# Patient Record
Sex: Male | Born: 1980 | Race: Black or African American | Hispanic: No | Marital: Single | Smoking: Current every day smoker
Health system: Southern US, Community
[De-identification: ages and names within clinical notes are randomized; demographics above are authoritative.]

## PROBLEM LIST (undated history)

## (undated) DIAGNOSIS — J302 Other seasonal allergic rhinitis: Secondary | ICD-10-CM

## (undated) DIAGNOSIS — R569 Unspecified convulsions: Secondary | ICD-10-CM

## (undated) HISTORY — PX: HAND SURGERY: SHX662

## (undated) HISTORY — PX: WISDOM TOOTH EXTRACTION: SHX21

---

## 2001-02-17 ENCOUNTER — Emergency Department (HOSPITAL_COMMUNITY): Admission: EM | Admit: 2001-02-17 | Discharge: 2001-02-17 | Payer: Self-pay | Admitting: Emergency Medicine

## 2003-06-11 ENCOUNTER — Emergency Department (HOSPITAL_COMMUNITY): Admission: EM | Admit: 2003-06-11 | Discharge: 2003-06-11 | Payer: Self-pay | Admitting: Emergency Medicine

## 2003-11-11 ENCOUNTER — Emergency Department (HOSPITAL_COMMUNITY): Admission: EM | Admit: 2003-11-11 | Discharge: 2003-11-11 | Payer: Self-pay | Admitting: Emergency Medicine

## 2005-01-02 ENCOUNTER — Emergency Department (HOSPITAL_COMMUNITY): Admission: EM | Admit: 2005-01-02 | Discharge: 2005-01-02 | Payer: Self-pay | Admitting: Emergency Medicine

## 2005-01-10 ENCOUNTER — Emergency Department (HOSPITAL_COMMUNITY): Admission: EM | Admit: 2005-01-10 | Discharge: 2005-01-10 | Payer: Self-pay | Admitting: Emergency Medicine

## 2005-06-10 ENCOUNTER — Emergency Department (HOSPITAL_COMMUNITY): Admission: EM | Admit: 2005-06-10 | Discharge: 2005-06-10 | Payer: Self-pay | Admitting: Emergency Medicine

## 2006-06-30 ENCOUNTER — Emergency Department (HOSPITAL_COMMUNITY): Admission: EM | Admit: 2006-06-30 | Discharge: 2006-07-01 | Payer: Self-pay | Admitting: Emergency Medicine

## 2006-07-06 ENCOUNTER — Emergency Department (HOSPITAL_COMMUNITY): Admission: EM | Admit: 2006-07-06 | Discharge: 2006-07-06 | Payer: Self-pay | Admitting: Emergency Medicine

## 2007-10-19 ENCOUNTER — Emergency Department (HOSPITAL_COMMUNITY): Admission: EM | Admit: 2007-10-19 | Discharge: 2007-10-19 | Payer: Self-pay | Admitting: Emergency Medicine

## 2008-03-19 ENCOUNTER — Emergency Department (HOSPITAL_COMMUNITY): Admission: EM | Admit: 2008-03-19 | Discharge: 2008-03-19 | Payer: Self-pay | Admitting: Emergency Medicine

## 2008-12-15 ENCOUNTER — Emergency Department (HOSPITAL_COMMUNITY): Admission: EM | Admit: 2008-12-15 | Discharge: 2008-12-16 | Payer: Self-pay | Admitting: Emergency Medicine

## 2009-06-29 ENCOUNTER — Emergency Department (HOSPITAL_COMMUNITY): Admission: EM | Admit: 2009-06-29 | Discharge: 2009-06-29 | Payer: Self-pay | Admitting: Emergency Medicine

## 2010-06-06 LAB — GC/CHLAMYDIA PROBE AMP, GENITAL
Chlamydia, DNA Probe: NEGATIVE
GC Probe Amp, Genital: POSITIVE — AB

## 2010-06-14 ENCOUNTER — Emergency Department (HOSPITAL_COMMUNITY)
Admission: EM | Admit: 2010-06-14 | Discharge: 2010-06-14 | Disposition: A | Discharge status: Home / Self Care | Payer: Medicaid Other | Attending: Emergency Medicine | Admitting: Emergency Medicine

## 2010-06-14 ENCOUNTER — Emergency Department (HOSPITAL_COMMUNITY): Payer: Medicaid Other

## 2010-06-14 DIAGNOSIS — G40909 Epilepsy, unspecified, not intractable, without status epilepticus: Secondary | ICD-10-CM | POA: Insufficient documentation

## 2010-06-14 DIAGNOSIS — M79609 Pain in unspecified limb: Secondary | ICD-10-CM | POA: Insufficient documentation

## 2010-06-14 DIAGNOSIS — M7989 Other specified soft tissue disorders: Secondary | ICD-10-CM | POA: Insufficient documentation

## 2010-06-14 DIAGNOSIS — M2550 Pain in unspecified joint: Secondary | ICD-10-CM | POA: Insufficient documentation

## 2010-06-14 DIAGNOSIS — S63639A Sprain of interphalangeal joint of unspecified finger, initial encounter: Secondary | ICD-10-CM | POA: Insufficient documentation

## 2010-06-14 DIAGNOSIS — Y929 Unspecified place or not applicable: Secondary | ICD-10-CM | POA: Insufficient documentation

## 2010-06-14 DIAGNOSIS — Y9367 Activity, basketball: Secondary | ICD-10-CM | POA: Insufficient documentation

## 2010-06-14 DIAGNOSIS — F17200 Nicotine dependence, unspecified, uncomplicated: Secondary | ICD-10-CM | POA: Insufficient documentation

## 2010-06-14 DIAGNOSIS — W219XXA Striking against or struck by unspecified sports equipment, initial encounter: Secondary | ICD-10-CM | POA: Insufficient documentation

## 2010-06-17 LAB — CBC
HCT: 46.9 % (ref 39.0–52.0)
Hemoglobin: 15.4 g/dL (ref 13.0–17.0)
MCHC: 32.8 g/dL (ref 30.0–36.0)
MCV: 99.2 fL (ref 78.0–100.0)
Platelets: 233 10*3/uL (ref 150–400)
RBC: 4.73 MIL/uL (ref 4.22–5.81)
RDW: 13.1 % (ref 11.5–15.5)
WBC: 9.6 10*3/uL (ref 4.0–10.5)

## 2010-06-17 LAB — DIFFERENTIAL
Basophils Absolute: 0 10*3/uL (ref 0.0–0.1)
Basophils Relative: 0 % (ref 0–1)
Eosinophils Absolute: 0.2 10*3/uL (ref 0.0–0.7)
Eosinophils Relative: 3 % (ref 0–5)
Lymphocytes Relative: 25 % (ref 12–46)
Lymphs Abs: 2.4 10*3/uL (ref 0.7–4.0)
Monocytes Absolute: 0.6 10*3/uL (ref 0.1–1.0)
Monocytes Relative: 6 % (ref 3–12)
Neutro Abs: 6.3 10*3/uL (ref 1.7–7.7)
Neutrophils Relative %: 66 % (ref 43–77)

## 2010-06-17 LAB — RAPID URINE DRUG SCREEN, HOSP PERFORMED
Amphetamines: NOT DETECTED
Barbiturates: NOT DETECTED
Benzodiazepines: NOT DETECTED
Cocaine: NOT DETECTED
Opiates: NOT DETECTED
Tetrahydrocannabinol: POSITIVE — AB

## 2010-06-17 LAB — COMPREHENSIVE METABOLIC PANEL
ALT: 21 U/L (ref 0–53)
AST: 24 U/L (ref 0–37)
Albumin: 4.6 g/dL (ref 3.5–5.2)
Alkaline Phosphatase: 79 U/L (ref 39–117)
BUN: 6 mg/dL (ref 6–23)
CO2: 23 mEq/L (ref 19–32)
Calcium: 9.7 mg/dL (ref 8.4–10.5)
Chloride: 103 mEq/L (ref 96–112)
Creatinine, Ser: 0.98 mg/dL (ref 0.4–1.5)
GFR calc Af Amer: >60 mL/min (ref >60)
GFR calc non Af Amer: >60 mL/min (ref >60)
Glucose, Bld: 103 mg/dL — ABNORMAL HIGH (ref 70–99)
Potassium: 3.3 mEq/L — ABNORMAL LOW (ref 3.5–5.1)
Sodium: 139 mEq/L (ref 135–145)
Total Bilirubin: 0.6 mg/dL (ref 0.3–1.2)
Total Protein: 7.4 g/dL (ref 6.0–8.3)

## 2010-06-17 LAB — URINALYSIS, ROUTINE W REFLEX MICROSCOPIC
Bilirubin Urine: NEGATIVE
Glucose, UA: NEGATIVE mg/dL
Hgb urine dipstick: NEGATIVE
Ketones, ur: NEGATIVE mg/dL
Nitrite: NEGATIVE
Protein, ur: NEGATIVE mg/dL
Specific Gravity, Urine: <1.005 — ABNORMAL LOW (ref 1.005–1.030)
Urobilinogen, UA: 0.2 mg/dL (ref 0.0–1.0)
pH: 5.5 (ref 5.0–8.0)

## 2010-06-17 LAB — ETHANOL: Alcohol, Ethyl (B): 214 mg/dL — ABNORMAL HIGH (ref 0–10)

## 2010-06-21 ENCOUNTER — Emergency Department (HOSPITAL_COMMUNITY)
Admission: EM | Admit: 2010-06-21 | Discharge: 2010-06-21 | Disposition: A | Discharge status: Home / Self Care | Payer: Medicaid Other | Attending: Emergency Medicine | Admitting: Emergency Medicine

## 2010-06-21 DIAGNOSIS — W219XXA Striking against or struck by unspecified sports equipment, initial encounter: Secondary | ICD-10-CM | POA: Insufficient documentation

## 2010-06-21 DIAGNOSIS — S6390XA Sprain of unspecified part of unspecified wrist and hand, initial encounter: Secondary | ICD-10-CM | POA: Insufficient documentation

## 2010-06-21 DIAGNOSIS — Y9367 Activity, basketball: Secondary | ICD-10-CM | POA: Insufficient documentation

## 2010-06-21 DIAGNOSIS — Y9239 Other specified sports and athletic area as the place of occurrence of the external cause: Secondary | ICD-10-CM | POA: Insufficient documentation

## 2010-11-23 ENCOUNTER — Emergency Department (HOSPITAL_COMMUNITY)
Admission: EM | Admit: 2010-11-23 | Discharge: 2010-11-23 | Disposition: A | Discharge status: Home / Self Care | Payer: Medicaid Other | Attending: Emergency Medicine | Admitting: Emergency Medicine

## 2010-11-23 ENCOUNTER — Emergency Department (HOSPITAL_COMMUNITY): Payer: Medicaid Other

## 2010-11-23 DIAGNOSIS — R059 Cough, unspecified: Secondary | ICD-10-CM | POA: Insufficient documentation

## 2010-11-23 DIAGNOSIS — J069 Acute upper respiratory infection, unspecified: Secondary | ICD-10-CM

## 2010-11-23 DIAGNOSIS — R05 Cough: Secondary | ICD-10-CM | POA: Insufficient documentation

## 2010-11-23 DIAGNOSIS — J029 Acute pharyngitis, unspecified: Secondary | ICD-10-CM | POA: Insufficient documentation

## 2010-11-23 LAB — RAPID STREP SCREEN (MED CTR MEBANE ONLY): Streptococcus, Group A Screen (Direct): NEGATIVE

## 2010-11-23 MED ORDER — IBUPROFEN 400 MG PO TABS
400.0000 mg | ORAL_TABLET | Freq: Once | ORAL | Status: AC
  Administered 2010-11-23: 400 mg via ORAL
  Filled 2010-11-23: qty 1

## 2010-11-23 MED ORDER — ACETAMINOPHEN 500 MG PO TABS
1000.0000 mg | ORAL_TABLET | Freq: Once | ORAL | Status: AC
  Administered 2010-11-23: 1000 mg via ORAL
  Filled 2010-11-23: qty 2

## 2010-11-23 MED ORDER — ALBUTEROL SULFATE HFA 108 (90 BASE) MCG/ACT IN AERS
2.0000 | INHALATION_SPRAY | RESPIRATORY_TRACT | Status: AC
  Administered 2010-11-23: 2 via RESPIRATORY_TRACT
  Filled 2010-11-23: qty 6.7

## 2010-11-23 NOTE — ED Provider Notes (Signed)
History  Scribed for Dr. Clarene Duke, the patient was seen in room 19. The chart was scribed by Gilman Schmidt. The patients care was started at 0740.  CSN: [id number] Arrival date & time: 11/23/2010  7:32 AM  Chief Complaint  Patient presents with  . Nasal Congestion  . Sore Throat     HPI Pt was seen at 0740.  Per pt, c/o gradual onset and persistence of constant runny/stuffy nose, sore throat, sinus and ears congestion that began last night.  Has not taken any OTC's for same.  Denies fevers, no rash, no CP/SOB, no abd pain, no N/V/D.    PAST MEDICAL HISTORY:  History reviewed. No pertinent past medical history.   PAST SURGICAL HISTORY:  History reviewed. No pertinent past surgical history.   MEDICATIONS:  Previous Medications   No medications on file     ALLERGIES:  Allergies as of 11/23/2010  . (No Known Allergies)      SOCIAL HISTORY: History  Substance Use Topics  . Smoking status: Current Everyday Smoker -- 1.0 packs/day  . Smokeless tobacco: Not on file  . Alcohol Use: Yes     " a little bit"       Review of Systems  Review of Systems ROS: Statement: All systems negative except as marked or noted in the HPI; Constitutional: Negative for fever and chills. ; ; Eyes: Negative for eye pain, redness and discharge. ; ; ENMT: Negative for ear pain, hoarseness, +runny/stuffy nose, nasal congestion, sinus pressure and sore throat.; Cardiovascular: Negative for chest pain, palpitations, diaphoresis, dyspnea and peripheral edema. ; ; Respiratory: Negative for cough, wheezing and stridor. ; ; Gastrointestinal: Negative for nausea, vomiting, diarrhea and abdominal pain, blood in stool, hematemesis, jaundice and rectal bleeding. . ; ; Genitourinary: Negative for dysuria, flank pain and hematuria. ; ; Musculoskeletal: Negative for back pain and neck pain. Negative for swelling and trauma.; ; Skin: Negative for pruritus, rash, abrasions, blisters, bruising and skin lesion.; ; Neuro:  Negative for headache, lightheadedness and neck stiffness. Negative for weakness, altered level of consciousness , altered mental status, extremity weakness, paresthesias, involuntary movement, seizure and syncope.     Physical Exam    BP 139/68  Pulse 64  Temp(Src) 97.6 F (36.4 C) (Oral)  Resp 20  Ht 5\' 6"  (1.676 m)  Wt 145 lb (65.772 kg)  BMI 23.40 kg/m2  SpO2 100%  Physical Exam 0745: Physical examination:  Nursing notes reviewed; Vital signs and O2 SAT reviewed;  Constitutional: Well developed, Well nourished, Well hydrated, In no acute distress; Head:  Normocephalic, atraumatic; Eyes: EOMI, PERRL, No scleral icterus; ENMT: TM's clear bilat.  +edemetous nasal turbinates bilat with clear rhinorrhea.  Mouth and pharynx normal, Mucous membranes moist, no intra-oral edema, no hoarse voice, no drooling or stridor; Neck: Supple, Full range of motion, No lymphadenopathy, no meningeal signs; Cardiovascular: Regular rate and rhythm, No murmur, rub, or gallop; Respiratory: Breath sounds clear & equal bilaterally, No rales, rhonchi, wheezes, or rub, Normal respiratory effort/excursion; Chest: Nontender, Movement normal; Abdomen: Soft, Nontender, Nondistended, Normal bowel sounds;  Extremities: Pulses normal, No tenderness, No edema, No calf edema or asymmetry.; Neuro: AA&Ox3, Major CN grossly intact.  Speech clear.  No gross focal motor or sensory deficits in extremities.; Skin: Color normal, Warm, Dry, no rash, no petechiae.    MDM Reviewed: nursing note and vitals Interpretation: labs and x-ray   7:59 AM:  Pt told ED RN that he is "coughing" and "chest feels tight."  +rhinorrhea and hyperventilating  on exam, appears anxious.  Sats 100% R/A, lungs CTA bilat.  No wheezing or stridor.  When asked what is wrong, pt states he "can't breathe out of my nose" and that has "gotten me upset."  Instructed how to mouth-breath and blow his nose frequently.  Will obtain CXR r/o infiltrate.     LABS:    Results for orders placed during the hospital encounter of 11/23/10  RAPID STREP SCREEN      Component Value Range   Streptococcus, Group A Screen (Direct) NEGATIVE  NEGATIVE    RADIOLOGY:  Dg Chest 2 View  11/23/2010  *RADIOLOGY REPORT*  Clinical Data: Fever and leg weakness  CHEST - 2 VIEW  Comparison: None  Findings: Normal mediastinum and cardiac silhouette.  Normal pulmonary  vasculature.  No evidence of effusion, infiltrate, or pneumothorax.  No acute bony abnormality.  IMPRESSION: No acute cardiopulmonary process.  Original Report Authenticated By: Genevive Bi, M.D.    Medications given in ED:  acetaminophen (TYLENOL) tablet 1,000 mg (1000 mg Oral Given 11/23/10 0807)  ibuprofen (ADVIL,MOTRIN) tablet 400 mg (400 mg Oral Given 11/23/10 0807)  albuterol (PROVENTIL HFA;VENTOLIN HFA) inhaler 2 puff (2 puff Inhalation Given 11/23/10 0836)    9:33 AM:  Improved after meds.  Wants to go home now.  Dx testing d/w pt and family.  Questions answered.  Verb understanding, agreeable to d/c home with outpt f/u.  [facility] M     I personally performed the services described in this documentation, which was scribed in my presence. The recorded information has been reviewed and considered. [facility] M    Procedures          Laray Anger, DO 11/23/10 2057

## 2010-11-23 NOTE — ED Notes (Signed)
Pt c/o sudden onset of midsternal chest pain. Pt describes it as tightness. I placed pt on monitor and EDP notified.

## 2010-11-23 NOTE — ED Notes (Signed)
Pt presents with c/o nasal congestion and sore throat x 1 day. Pt denies taking any medication for symptoms. No resp distress in triage.

## 2011-02-05 ENCOUNTER — Encounter (HOSPITAL_COMMUNITY): Payer: Self-pay | Admitting: *Deleted

## 2011-02-05 ENCOUNTER — Emergency Department (HOSPITAL_COMMUNITY)
Admission: EM | Admit: 2011-02-05 | Discharge: 2011-02-05 | Disposition: A | Discharge status: Home / Self Care | Payer: Medicaid Other | Attending: Emergency Medicine | Admitting: Emergency Medicine

## 2011-02-05 DIAGNOSIS — K089 Disorder of teeth and supporting structures, unspecified: Secondary | ICD-10-CM | POA: Insufficient documentation

## 2011-02-05 DIAGNOSIS — F17200 Nicotine dependence, unspecified, uncomplicated: Secondary | ICD-10-CM | POA: Insufficient documentation

## 2011-02-05 DIAGNOSIS — K0889 Other specified disorders of teeth and supporting structures: Secondary | ICD-10-CM

## 2011-02-05 MED ORDER — PENICILLIN V POTASSIUM 250 MG PO TABS
500.0000 mg | ORAL_TABLET | Freq: Once | ORAL | Status: AC
  Administered 2011-02-05: 500 mg via ORAL

## 2011-02-05 MED ORDER — IBUPROFEN 800 MG PO TABS
800.0000 mg | ORAL_TABLET | Freq: Once | ORAL | Status: AC
  Administered 2011-02-05: 800 mg via ORAL

## 2011-02-05 MED ORDER — PENICILLIN V POTASSIUM 250 MG PO TABS
ORAL_TABLET | ORAL | Status: AC
  Filled 2011-02-05: qty 2

## 2011-02-05 MED ORDER — IBUPROFEN 800 MG PO TABS
ORAL_TABLET | ORAL | Status: AC
  Filled 2011-02-05: qty 1

## 2011-02-05 MED ORDER — PENICILLIN V POTASSIUM 500 MG PO TABS: 500.0000 mg | ORAL_TABLET | Freq: Three times a day (TID) | ORAL | Status: AC

## 2011-02-05 MED ORDER — HYDROCODONE-ACETAMINOPHEN 5-500 MG PO TABS: 1.0000 | ORAL_TABLET | Freq: Four times a day (QID) | ORAL | Status: DC | PRN

## 2011-02-05 NOTE — ED Notes (Signed)
Dr. Delo at bedside. 

## 2011-02-05 NOTE — ED Provider Notes (Signed)
History     CSN: [id number] Arrival date & time: 02/05/2011  3:35 AM   First MD Initiated Contact with Patient 02/05/11 [phone]      Chief Complaint  Patient presents with  . Dental Pain    (Consider location/radiation/quality/duration/timing/severity/associated sxs/prior treatment) Patient is a 30 y.o. male presenting with tooth pain. The history is provided by the patient.  Dental PainThe primary symptoms include mouth pain. The symptoms began 2 days ago. The symptoms are worsening. The symptoms are new. The symptoms occur constantly.  Additional symptoms include: dental sensitivity to temperature.    History reviewed. No pertinent past medical history.  History reviewed. No pertinent past surgical history.  No family history on file.  History  Substance Use Topics  . Smoking status: Current Everyday Smoker -- 1.0 packs/day  . Smokeless tobacco: Not on file  . Alcohol Use: Yes     " a little bit"      Review of Systems  All other systems reviewed and are negative.    Allergies  Review of patient's allergies indicates no known allergies.  Home Medications  No current outpatient prescriptions on file.  BP 137/72  Pulse 83  Temp(Src) 97.8 F (36.6 C) (Oral)  Resp 18  Ht 5\' 6"  (1.676 m)  Wt 161 lb (73.029 kg)  BMI 25.99 kg/m2  SpO2 99%  Physical Exam  Constitutional: He is oriented to person, place, and time. He appears well-developed and well-nourished.  HENT:  Head: Normocephalic and atraumatic.       The left lower rear molar is tender with erythema surrounding it.  There is no abscess that I can appreciate.  Neck: Normal range of motion. Neck supple.  Lymphadenopathy:    He has no cervical adenopathy.  Neurological: He is alert and oriented to person, place, and time.  Skin: Skin is warm and dry.    ED Course  Procedures (including critical care time)  Labs Reviewed - No data to display No results found.   No diagnosis found.    MDM  Pain  meds, antibx, follow up with dentist.        Geoffery Lyons, MD 02/05/11 [phone]

## 2011-02-05 NOTE — ED Notes (Signed)
Pt reports toothache on left side.  States it has been hurting since Sunday.  Pt states he was supposed to see a dentist 2 months ago but did not follow up.

## 2011-02-05 NOTE — ED Notes (Signed)
Pt reports pain to rt side of mouth upper and lower teeth x 3 days

## 2011-02-13 ENCOUNTER — Emergency Department (HOSPITAL_COMMUNITY)
Admission: EM | Admit: 2011-02-13 | Discharge: 2011-02-13 | Disposition: A | Discharge status: Home / Self Care | Payer: Medicaid Other | Attending: Emergency Medicine | Admitting: Emergency Medicine

## 2011-02-13 ENCOUNTER — Encounter (HOSPITAL_COMMUNITY): Payer: Self-pay | Admitting: Emergency Medicine

## 2011-02-13 DIAGNOSIS — F17200 Nicotine dependence, unspecified, uncomplicated: Secondary | ICD-10-CM | POA: Insufficient documentation

## 2011-02-13 DIAGNOSIS — S40029A Contusion of unspecified upper arm, initial encounter: Secondary | ICD-10-CM | POA: Insufficient documentation

## 2011-02-13 DIAGNOSIS — S40021A Contusion of right upper arm, initial encounter: Secondary | ICD-10-CM

## 2011-02-13 DIAGNOSIS — Y9229 Other specified public building as the place of occurrence of the external cause: Secondary | ICD-10-CM | POA: Insufficient documentation

## 2011-02-13 DIAGNOSIS — X58XXXA Exposure to other specified factors, initial encounter: Secondary | ICD-10-CM | POA: Insufficient documentation

## 2011-02-13 MED ORDER — IBUPROFEN 800 MG PO TABS
800.0000 mg | ORAL_TABLET | Freq: Once | ORAL | Status: AC
  Administered 2011-02-13: 800 mg via ORAL
  Filled 2011-02-13: qty 1

## 2011-02-13 NOTE — ED Provider Notes (Signed)
Medical screening examination/treatment/procedure(s) were performed by non-physician practitioner and as supervising physician I was immediately available for consultation/collaboration.   Briona Korpela L Elva Breaker, MD 02/13/11 1508 

## 2011-02-13 NOTE — ED Provider Notes (Signed)
History     CSN: [id number] Arrival date & time: 02/13/2011 11:13 AM   First MD Initiated Contact with Patient 02/13/11 1343      Chief Complaint  Patient presents with  . Bleeding/Bruising    (Consider location/radiation/quality/duration/timing/severity/associated sxs/prior treatment) HPI Comments: Pt underwent sedation for extraction of 4 wisdom teeth yest at dr. Allena Earing' office.  He says they had some difficulty rousing him from sedation and repeatedly pinched his R inner biceps area.  He feels they were intentionally mistreating him.  He wants [place] to take pictures.  The history is provided by the patient and the spouse. No language interpreter was used.    History reviewed. No pertinent past medical history.  Past Surgical History  Procedure Date  . Wisdom tooth extraction     History reviewed. No pertinent family history.  History  Substance Use Topics  . Smoking status: Current Everyday Smoker -- 1.0 packs/day  . Smokeless tobacco: Not on file  . Alcohol Use: Yes     " a little bit"      Review of Systems  Skin:       Ecchymosis   All other systems reviewed and are negative.    Allergies  Review of patient's allergies indicates no known allergies.  Home Medications   Current Outpatient Rx  Name Route Sig Dispense Refill  . HYDROCODONE-ACETAMINOPHEN 5-325 MG PO TABS Oral Take 1 tablet by mouth every 4 (four) hours as needed. For pain     . IBUPROFEN 800 MG PO TABS Oral Take 800 mg by mouth every 6 (six) hours as needed. For pain     . PENICILLIN V POTASSIUM 500 MG PO TABS Oral Take 1 tablet (500 mg total) by mouth 3 (three) times daily. 30 tablet 0    BP 125/79  Pulse 83  Temp(Src) 98.5 F (36.9 C) (Oral)  Resp 18  Ht 5\' 6"  (1.676 m)  Wt 160 lb (72.576 kg)  BMI 25.82 kg/m2  SpO2 100%  Physical Exam  Nursing note and vitals reviewed. Constitutional: He is oriented to person, place, and time. He appears well-developed and well-nourished.    HENT:  Head: Normocephalic and atraumatic.  Eyes: EOM are normal.  Neck: Normal range of motion.  Cardiovascular: Normal rate, regular rhythm, normal heart sounds and intact distal pulses.   Pulmonary/Chest: Effort normal and breath sounds normal. No respiratory distress.  Abdominal: Soft. He exhibits no distension. There is no tenderness.  Musculoskeletal: Normal range of motion. He exhibits tenderness.       Arms: Neurological: He is alert and oriented to person, place, and time.  Skin: Skin is warm and dry.  Psychiatric: He has a normal mood and affect. Judgment normal.    ED Course  Procedures (including critical care time)  Labs Reviewed - No data to display No results found.   No diagnosis found.    MDM          Worthy Rancher, PA 02/13/11 1414

## 2011-02-13 NOTE — ED Notes (Signed)
Pt c/o bruising on right upper arm from being pinched at the dentist's office while coming out of anesthesia.

## 2011-06-10 ENCOUNTER — Emergency Department (HOSPITAL_COMMUNITY)
Admission: EM | Admit: 2011-06-10 | Discharge: 2011-06-10 | Disposition: A | Discharge status: Home / Self Care | Payer: Medicaid Other | Attending: Emergency Medicine | Admitting: Emergency Medicine

## 2011-06-10 ENCOUNTER — Emergency Department (HOSPITAL_COMMUNITY): Payer: Medicaid Other

## 2011-06-10 ENCOUNTER — Encounter (HOSPITAL_COMMUNITY): Payer: Self-pay

## 2011-06-10 ENCOUNTER — Other Ambulatory Visit: Payer: Self-pay

## 2011-06-10 DIAGNOSIS — R0789 Other chest pain: Secondary | ICD-10-CM

## 2011-06-10 DIAGNOSIS — J020 Streptococcal pharyngitis: Secondary | ICD-10-CM | POA: Insufficient documentation

## 2011-06-10 DIAGNOSIS — R071 Chest pain on breathing: Secondary | ICD-10-CM | POA: Insufficient documentation

## 2011-06-10 DIAGNOSIS — R079 Chest pain, unspecified: Secondary | ICD-10-CM | POA: Insufficient documentation

## 2011-06-10 LAB — RAPID STREP SCREEN (MED CTR MEBANE ONLY): Streptococcus, Group A Screen (Direct): POSITIVE — AB

## 2011-06-10 MED ORDER — FAMOTIDINE 20 MG PO TABS
20.0000 mg | ORAL_TABLET | Freq: Once | ORAL | Status: AC
  Administered 2011-06-10: 20 mg via ORAL
  Filled 2011-06-10: qty 1

## 2011-06-10 MED ORDER — IBUPROFEN 800 MG PO TABS
800.0000 mg | ORAL_TABLET | Freq: Once | ORAL | Status: AC
  Administered 2011-06-10: 800 mg via ORAL
  Filled 2011-06-10: qty 1

## 2011-06-10 MED ORDER — PENICILLIN V POTASSIUM 500 MG PO TABS: 500.0000 mg | ORAL_TABLET | Freq: Four times a day (QID) | ORAL | Status: AC

## 2011-06-10 MED ORDER — PENICILLIN V POTASSIUM 250 MG PO TABS
1000.0000 mg | ORAL_TABLET | Freq: Once | ORAL | Status: AC
  Administered 2011-06-10: 1000 mg via ORAL
  Filled 2011-06-10: qty 4

## 2011-06-10 NOTE — ED Notes (Signed)
Complain of sore throat 

## 2011-06-10 NOTE — Discharge Instructions (Signed)
Chest Pain (Nonspecific) It is often hard to give a specific diagnosis for the cause of chest pain. There is always a chance that your pain could be related to something serious, such as a heart attack or a blood clot in the lungs. You need to follow up with your caregiver for further evaluation. CAUSES   Heartburn.   Pneumonia or bronchitis.   Anxiety or stress.   Inflammation around your heart (pericarditis) or lung (pleuritis or pleurisy).   A blood clot in the lung.   A collapsed lung (pneumothorax). It can develop suddenly on its own (spontaneous pneumothorax) or from injury (trauma) to the chest.   Shingles infection (herpes zoster virus).  The chest wall is composed of bones, muscles, and cartilage. Any of these can be the source of the pain.  The bones can be bruised by injury.   The muscles or cartilage can be strained by coughing or overwork.   The cartilage can be affected by inflammation and become sore (costochondritis).  DIAGNOSIS  Lab tests or other studies, such as X-rays, electrocardiography, stress testing, or cardiac imaging, may be needed to find the cause of your pain.  TREATMENT   Treatment depends on what may be causing your chest pain. Treatment may include:   Acid blockers for heartburn.   Anti-inflammatory medicine.   Pain medicine for inflammatory conditions.   Antibiotics if an infection is present.   You may be advised to change lifestyle habits. This includes stopping smoking and avoiding alcohol, caffeine, and chocolate.   You may be advised to keep your head raised (elevated) when sleeping. This reduces the chance of acid going backward from your stomach into your esophagus.   Most of the time, nonspecific chest pain will improve within 2 to 3 days with rest and mild pain medicine.  HOME CARE INSTRUCTIONS   If antibiotics were prescribed, take your antibiotics as directed. Finish them even if you start to feel better.   For the next few  days, avoid physical activities that bring on chest pain. Continue physical activities as directed.   Do not smoke.   Avoid drinking alcohol.   Only take over-the-counter or prescription medicine for pain, discomfort, or fever as directed by your caregiver.   Follow your caregiver's suggestions for further testing if your chest pain does not go away.   Keep any follow-up appointments you made. If you do not go to an appointment, you could develop lasting (chronic) problems with pain. If there is any problem keeping an appointment, you must call to reschedule.  SEEK MEDICAL CARE IF:   You think you are having problems from the medicine you are taking. Read your medicine instructions carefully.   Your chest pain does not go away, even after treatment.   You develop a rash with blisters on your chest.  SEEK IMMEDIATE MEDICAL CARE IF:   You have increased chest pain or pain that spreads to your arm, neck, jaw, back, or abdomen.   You develop shortness of breath, an increasing cough, or you are coughing up blood.   You have severe back or abdominal pain, feel nauseous, or vomit.   You develop severe weakness, fainting, or chills.   You have a fever.  THIS IS AN EMERGENCY. Do not wait to see if the pain will go away. Get medical help at once. Call your local emergency services (911 in [place] Do not drive yourself to the hospital. MAKE SURE YOU:   Understand these instructions.     Will watch your condition.   Will get help right away if you are not doing well or get worse.  Document Released: 11/27/2004 Document Revised: 02/06/2011 Document Reviewed: 09/23/2007 [facility] Patient Information 2012 [place], [place].Strep Throat Strep throat is an infection of the throat caused by a bacteria named Streptococcus pyogenes. Your caregiver may call the infection streptococcal "tonsillitis" or "pharyngitis" depending on whether there are signs of inflammation in the tonsils or back of the throat.  Strep throat is most common in children from 74 to 64 years old during the cold months of the year, but it can occur in people of any age during any season. This infection is spread from person to person (contagious) through coughing, sneezing, or other close contact. SYMPTOMS   Fever or chills.   Painful, swollen, red tonsils or throat.   Pain or difficulty when swallowing.   White or yellow spots on the tonsils or throat.   Swollen, tender lymph nodes or "glands" of the neck or under the jaw.   Red rash all over the body (rare).  DIAGNOSIS  Many different infections can cause the same symptoms. A test must be done to confirm the diagnosis so the right treatment can be given. A "rapid strep test" can help your caregiver make the diagnosis in a few minutes. If this test is not available, a light swab of the infected area can be used for a throat culture test. If a throat culture test is done, results are usually available in a day or two. TREATMENT  Strep throat is treated with antibiotic medicine. HOME CARE INSTRUCTIONS   Gargle with 1 tsp of salt in 1 cup of warm water, 3 to 4 times per day or as needed for comfort.   Family members who also have a sore throat or fever should be tested for strep throat and treated with antibiotics if they have the strep infection.   Make sure everyone in your household washes their hands well.   Do not share food, drinking cups, or personal items that could cause the infection to spread to others.   You may need to eat a soft food diet until your sore throat gets better.   Drink enough water and fluids to keep your urine clear or pale yellow. This will help prevent dehydration.   Get plenty of rest.   Stay home from school, daycare, or work until you have been on antibiotics for 24 hours.   Only take over-the-counter or prescription medicines for pain, discomfort, or fever as directed by your caregiver.   If antibiotics are prescribed, take  them as directed. Finish them even if you start to feel better.  SEEK MEDICAL CARE IF:   The glands in your neck continue to enlarge.   You develop a rash, cough, or earache.   You cough up green, yellow-brown, or bloody sputum.   You have pain or discomfort not controlled by medicines.   Your problems seem to be getting worse rather than better.  SEEK IMMEDIATE MEDICAL CARE IF:   You develop any new symptoms such as vomiting, severe headache, stiff or painful neck, chest pain, shortness of breath, or trouble swallowing.   You develop severe throat pain, drooling, or changes in your voice.   You develop swelling of the neck, or the skin on the neck becomes red and tender.   You have a fever.   You develop signs of dehydration, such as fatigue, dry mouth, and decreased urination.   You  become increasingly sleepy, or you cannot wake up completely.  Document Released: 02/15/2000 Document Revised: 02/06/2011 Document Reviewed: 04/18/2010 [facility] Patient Information 2012 [place], [place].   Take the antibiotic as directed until gone.  Gargle frequently with salt water will help.  Take tylenol up to 1000 mg every 4 hrs or ibuprofen up to 800 mg every 8 hrs for fever or pain.  Find a primary care MD.

## 2011-06-10 NOTE — ED Provider Notes (Signed)
Medical screening examination/treatment/procedure(s) were performed by non-physician practitioner and as supervising physician I was immediately available for consultation/collaboration.  Larken Urias, MD 06/10/11 2056 

## 2011-06-10 NOTE — ED Provider Notes (Signed)
History     CSN: [id number]  Arrival date & time 06/10/11  1347   First MD Initiated Contact with Patient 06/10/11 1455      Chief Complaint  Patient presents with  . Sore Throat    (Consider location/radiation/quality/duration/timing/severity/associated sxs/prior treatment) HPI Comments: Sore throat for several days.  General aches and pains.  Difficulty eating and swallowing for past 3 days.  Began c/o R sternal border chest pain while in exam room.  "sharp" no radiating.  No n/v.  No COP or diaphoresis.  No pre-syncopal sxs.  No PMH.  No PCP.  The history is provided by the patient. No language interpreter was used.    History reviewed. No pertinent past medical history.  Past Surgical History  Procedure Date  . Wisdom tooth extraction     No family history on file.  History  Substance Use Topics  . Smoking status: Current Everyday Smoker -- 1.0 packs/day  . Smokeless tobacco: Not on file  . Alcohol Use: Yes     " a little bit"      Review of Systems  Constitutional: Positive for appetite change. Negative for fever, chills and diaphoresis.  HENT: Positive for sore throat.   Respiratory: Negative for cough, chest tightness, shortness of breath, wheezing and stridor.   Cardiovascular: Positive for chest pain. Negative for palpitations and leg swelling.  All other systems reviewed and are negative.    Allergies  Review of patient's allergies indicates no known allergies.  Home Medications   Current Outpatient Rx  Name Route Sig Dispense Refill  . DM-DOXYLAMINE-ACETAMINOPHEN 30-12.07-998 MG/30ML PO LIQD Oral Take 30 mLs by mouth daily.    . IBUPROFEN 200 MG PO TABS Oral Take 400 mg by mouth every 6 (six) hours as needed. Pain    . PENICILLIN V POTASSIUM 500 MG PO TABS Oral Take 1 tablet (500 mg total) by mouth 4 (four) times daily. 40 tablet 0    BP 121/67  Pulse 92  Temp(Src) 97.8 F (36.6 C) (Oral)  Resp 18  Ht 5\' 6"  (1.676 m)  Wt 152 lb (68.947 kg)   BMI 24.53 kg/m2  SpO2 100%  Physical Exam  Nursing note and vitals reviewed. Constitutional: He is oriented to person, place, and time. He appears well-developed and well-nourished.  HENT:  Head: Normocephalic and atraumatic.  Right Ear: External ear normal.  Left Ear: External ear normal.  Mouth/Throat: Uvula is midline and mucous membranes are normal. No dental abscesses, uvula swelling or dental caries. Oropharyngeal exudate and posterior oropharyngeal erythema present.  Eyes: EOM are normal.  Neck: Trachea normal, normal range of motion and full passive range of motion without pain. No JVD present. No rigidity. No tracheal deviation and normal range of motion present.  Cardiovascular: Normal rate, regular rhythm, S1 normal, S2 normal, normal heart sounds, intact distal pulses and normal pulses.  Exam reveals no gallop, no distant heart sounds and no friction rub.   No murmur heard. Pulmonary/Chest: Effort normal and breath sounds normal. No accessory muscle usage. Not tachypneic. No respiratory distress. He has no decreased breath sounds. He has no wheezes. He has no rhonchi. He has no rales. He exhibits no tenderness.    Abdominal: Soft. He exhibits no distension. There is no tenderness.  Musculoskeletal: Normal range of motion.  Lymphadenopathy:    He has no cervical adenopathy.  Neurological: He is alert and oriented to person, place, and time.  Skin: Skin is warm and dry.  Psychiatric: He has  a normal mood and affect. Judgment normal.   Reviewed CXR and strep screen results with pt.Marland Kitchen  He prefers the po penicillin vs IM. ED Course  Procedures (including critical care time)  Labs Reviewed  RAPID STREP SCREEN - Abnormal; Notable for the following:    Streptococcus, Group A Screen (Direct) POSITIVE (*)    All other components within normal limits   Dg Chest 2 View  06/10/2011  *RADIOLOGY REPORT*  Clinical Data: Right sternal pain.  Smoker.  CHEST - 2 VIEW  Comparison:  11/23/2010.  Findings: No infiltrate, congestive heart failure or pneumothorax. Heart size within normal limits.  Minimal scoliosis.  IMPRESSION: No acute abnormality.  Original Report Authenticated By: Fuller Canada, M.D.     1. Strep throat   2. Chest wall pain       MDM    He began having "sharp" R substernal cp several minutes ago.  No radiation.  No diaphoresis, SOC, nausea vomiting or pre-syncopal sxs.  Date: 06/10/2011  Rate: 92  Rhythm: normal sinus rhythm  QRS Axis: normal  Intervals: normal  ST/T Wave abnormalities: normal  Conduction Disutrbances:none  Narrative Interpretation:   Old EKG Reviewed: none available  rx- pen VK 500, QID, 40 Ibuprofen 800 TID Salt water gargles chloraseptic       Worthy Rancher, PA 06/10/11 1740  Worthy Rancher, PA 06/10/11 1743

## 2011-07-06 ENCOUNTER — Emergency Department (HOSPITAL_COMMUNITY)
Admission: EM | Admit: 2011-07-06 | Discharge: 2011-07-06 | Disposition: A | Discharge status: Home / Self Care | Payer: Medicaid Other | Attending: Emergency Medicine | Admitting: Emergency Medicine

## 2011-07-06 ENCOUNTER — Encounter (HOSPITAL_COMMUNITY): Payer: Self-pay

## 2011-07-06 DIAGNOSIS — H6122 Impacted cerumen, left ear: Secondary | ICD-10-CM

## 2011-07-06 DIAGNOSIS — F17200 Nicotine dependence, unspecified, uncomplicated: Secondary | ICD-10-CM | POA: Insufficient documentation

## 2011-07-06 DIAGNOSIS — H6120 Impacted cerumen, unspecified ear: Secondary | ICD-10-CM | POA: Insufficient documentation

## 2011-07-06 NOTE — ED Provider Notes (Signed)
Medical screening examination/treatment/procedure(s) were performed by non-physician practitioner and as supervising physician I was immediately available for consultation/collaboration.  Deontrey Massi, MD 07/06/11 0911 

## 2011-07-06 NOTE — ED Provider Notes (Signed)
History     CSN: [id number]  Arrival date & time 07/06/11  0706   First MD Initiated Contact with Patient 07/06/11 0813      Chief Complaint  Patient presents with  . Cerumen Impaction    (Consider location/radiation/quality/duration/timing/severity/associated sxs/prior treatment) HPI Comments: "i can't hear out of my left ear".  Patient is a 31 y.o. male presenting with plugged ear sensation. The history is provided by the patient. No language interpreter was used.  Ear Fullness This is a new problem. The current episode started yesterday. The problem occurs constantly. The problem has been unchanged. Pertinent negatives include no coughing, fever or sore throat. The symptoms are aggravated by nothing. He has tried nothing for the symptoms.    History reviewed. No pertinent past medical history.  Past Surgical History  Procedure Date  . Wisdom tooth extraction     No family history on file.  History  Substance Use Topics  . Smoking status: Current Everyday Smoker -- 1.0 packs/day  . Smokeless tobacco: Not on file  . Alcohol Use: Yes     " a little bit"      Review of Systems  Constitutional: Negative for fever.  HENT: Positive for hearing loss. Negative for ear pain, sore throat and ear discharge.   Respiratory: Negative for cough.   All other systems reviewed and are negative.    Allergies  Review of patient's allergies indicates no known allergies.  Home Medications   Current Outpatient Rx  Name Route Sig Dispense Refill  . DM-DOXYLAMINE-ACETAMINOPHEN 30-12.07-998 MG/30ML PO LIQD Oral Take 30 mLs by mouth daily.    . IBUPROFEN 200 MG PO TABS Oral Take 400 mg by mouth every 6 (six) hours as needed. Pain      BP 109/51  Pulse 64  Temp(Src) 97.3 F (36.3 C) (Oral)  Resp 20  Ht 5\' 6"  (1.676 m)  Wt 163 lb (73.936 kg)  BMI 26.31 kg/m2  SpO2 100%  Physical Exam  Nursing note and vitals reviewed. Constitutional: He is oriented to person, place, and  time. He appears well-developed and well-nourished.  HENT:  Head: Normocephalic and atraumatic.  Right Ear: Hearing, tympanic membrane, external ear and ear canal normal.  Left Ear: External ear normal.       L canal obstructed with cerumen.  TM and other landmarks not visible. L ear re-examined after irrigation.  Nearly all cerumen removed.  i removed a little more  With a curette.  TM visible and not bulging.  Normal landmarks.  No erythema.  No air/fluid levels visualized.  Eyes: EOM are normal.  Neck: Normal range of motion.  Cardiovascular: Normal rate, regular rhythm, normal heart sounds and intact distal pulses.   Pulmonary/Chest: Effort normal and breath sounds normal. No respiratory distress.  Abdominal: Soft. He exhibits no distension. There is no tenderness.  Musculoskeletal: Normal range of motion.  Neurological: He is alert and oriented to person, place, and time.  Skin: Skin is warm and dry.  Psychiatric: He has a normal mood and affect. Judgment normal.    ED Course  Procedures (including critical care time)  Labs Reviewed - No data to display No results found.   1. Cerumen impaction, left       MDM  Pt states ear still feels full.  Told to take sudafed.  Return if sxs change.        Worthy Rancher, PA 07/06/11 [phone]

## 2011-07-06 NOTE — ED Notes (Signed)
"  my left ear stopped up" per pt

## 2011-07-06 NOTE — Discharge Instructions (Signed)
Cerumen Impaction A cerumen impaction is when the wax in your ear forms a plug. This plug usually causes reduced hearing. Sometimes it also causes an earache or dizziness. Removing a cerumen impaction can be difficult and painful. The wax sticks to the ear canal. The canal is sensitive and bleeds easily. If you try to remove a heavy wax buildup with a cotton tipped swab, you may push it in further. Irrigation with water, suction, and small ear curettes may be used to clear out the wax. If the impaction is fixed to the skin in the ear canal, ear drops may be needed for a few days to loosen the wax. People who build up a lot of wax frequently can use ear wax removal products available in your local drugstore. SEEK MEDICAL CARE IF:  You develop an earache, increased hearing loss, or marked dizziness. Document Released: 03/27/2004 Document Revised: 02/06/2011 Document Reviewed: 05/17/2009 [facility] Patient Information 2012 [place], [place].   Insert 1-2 drops of peroxide in ears at bedtime to remove additional wax.  i suspect you may also have some eustachian  Tube dysfunction.  Take sudafed for the feeling of congestion.  Return to the ED if your symptoms worsen or change significantly.

## 2011-07-06 NOTE — ED Notes (Signed)
Patient with no complaints at this time. Respirations even and unlabored. Skin warm/dry. Discharge instructions reviewed with patient at this time. Patient given opportunity to voice concerns/ask questions. Patient discharged at this time and left Emergency Department with steady gait.   

## 2011-07-08 ENCOUNTER — Encounter (HOSPITAL_COMMUNITY): Payer: Self-pay | Admitting: Emergency Medicine

## 2011-07-08 ENCOUNTER — Emergency Department (HOSPITAL_COMMUNITY)
Admission: EM | Admit: 2011-07-08 | Discharge: 2011-07-08 | Disposition: A | Discharge status: Home / Self Care | Payer: Medicaid Other | Attending: Emergency Medicine | Admitting: Emergency Medicine

## 2011-07-08 DIAGNOSIS — H6590 Unspecified nonsuppurative otitis media, unspecified ear: Secondary | ICD-10-CM | POA: Insufficient documentation

## 2011-07-08 DIAGNOSIS — F17200 Nicotine dependence, unspecified, uncomplicated: Secondary | ICD-10-CM | POA: Insufficient documentation

## 2011-07-08 DIAGNOSIS — J45909 Unspecified asthma, uncomplicated: Secondary | ICD-10-CM | POA: Insufficient documentation

## 2011-07-08 DIAGNOSIS — H9319 Tinnitus, unspecified ear: Secondary | ICD-10-CM | POA: Insufficient documentation

## 2011-07-08 MED ORDER — ANTIPYRINE-BENZOCAINE 5.4-1.4 % OT SOLN
3.0000 [drp] | Freq: Once | OTIC | Status: AC
  Administered 2011-07-08: 4 [drp] via OTIC
  Filled 2011-07-08: qty 10

## 2011-07-08 MED ORDER — PREDNISONE 10 MG PO TABS: ORAL_TABLET | ORAL | Status: DC

## 2011-07-08 NOTE — ED Notes (Signed)
Pt presents with left ear discomfort and pressure. Pt denies fever, cough and nasal congestion.

## 2011-07-08 NOTE — ED Notes (Signed)
Still having ear pain.

## 2011-07-08 NOTE — ED Provider Notes (Signed)
History     CSN: [id number]  Arrival date & time 07/08/11  1000   First MD Initiated Contact with Patient 07/08/11 1003      Chief Complaint  Patient presents with  . Otalgia    (Consider location/radiation/quality/duration/timing/severity/associated sxs/prior treatment) HPI Comments: Patient c/o continued pain to his left ear for several days.  States that he was seen here two days prior to this visit and treated for a cerumen impaction.  States he is continuing to have pain and a "ringing noise" in his ear. He denies fever, congestion, sore throat, or neck pain.    Patient is a 31 y.o. male presenting with ear pain. The history is provided by the patient.  Otalgia This is a new problem. There is pain in the left ear. The problem occurs constantly. The problem has not changed since onset.There has been no fever. The pain is mild. Pertinent negatives include no ear discharge, no headaches, no rhinorrhea, no sore throat, no vomiting, no neck pain, no cough and no rash.    Past Medical History  Diagnosis Date  . Asthma     Past Surgical History  Procedure Date  . Wisdom tooth extraction     History reviewed. No pertinent family history.  History  Substance Use Topics  . Smoking status: Current Everyday Smoker -- 1.0 packs/day  . Smokeless tobacco: Not on file  . Alcohol Use: Yes     " a little bit"      Review of Systems  Constitutional: Negative for fever.  HENT: Positive for ear pain and tinnitus. Negative for congestion, sore throat, facial swelling, rhinorrhea, sneezing, trouble swallowing, neck pain and ear discharge.   Respiratory: Negative for cough.   Gastrointestinal: Negative for vomiting.  Skin: Negative for rash.  Neurological: Negative for dizziness, weakness, numbness and headaches.  Hematological: Negative for adenopathy.  All other systems reviewed and are negative.    Allergies  Review of patient's allergies indicates no known allergies.  Home  Medications   Current Outpatient Rx  Name Route Sig Dispense Refill  . DM-DOXYLAMINE-ACETAMINOPHEN 30-12.07-998 MG/30ML PO LIQD Oral Take 30 mLs by mouth daily.    . IBUPROFEN 200 MG PO TABS Oral Take 400 mg by mouth every 6 (six) hours as needed. Pain      BP 130/71  Pulse 63  Temp(Src) 98.6 F (37 C) (Oral)  Resp 18  Ht 5\' 5"  (1.651 m)  Wt 163 lb (73.936 kg)  BMI 27.12 kg/m2  SpO2 100%  Physical Exam  Nursing note and vitals reviewed. Constitutional: He is oriented to person, place, and time. He appears well-developed and well-nourished. No distress.  HENT:  Head: Normocephalic and atraumatic.  Right Ear: No mastoid tenderness. Tympanic membrane is not erythematous and not bulging. No hemotympanum.  Left Ear: No mastoid tenderness. Tympanic membrane is not erythematous and not bulging. A middle ear effusion is present. No hemotympanum.  Mouth/Throat: Oropharynx is clear and moist.  Eyes: EOM are normal. Pupils are equal, round, and reactive to light.  Neck: Normal range of motion. Neck supple.  Pulmonary/Chest: Effort normal and breath sounds normal.  Musculoskeletal: Normal range of motion.  Lymphadenopathy:    He has no cervical adenopathy.  Neurological: He is alert and oriented to person, place, and time. He exhibits normal muscle tone. Coordination normal.  Skin: Skin is warm and dry.    ED Course  Procedures (including critical care time)       MDM  Previous medical charts, nursing notes and vitals signs from this visit were reviewed by me   All laboratory results and/or imaging results performed on this visit, if applicable, were reviewed by me and discussed with the patient and/or parent as well as recommendation for follow-up    MEDICATIONS GIVEN IN ED:  none   Patient is alert, Non-toxic appearing.  Mild serous otitis media present.  No cervical or preauricular lymphadenopathy.  No mastoid tenderness    PRESCRIPTIONS GIVEN AT DISCHARGE:  prednisone taper      Pt stable in ED with no significant deterioration in condition. Pt feels improved after observation and/or treatment in ED. Patient / Family / Caregiver understand and agree with initial ED impression and plan with expectations set for ED visit.  Patient agrees to return to ED for any worsening symptoms       Juan Ward L. Eladia Frame, [place] 07/12/11 2245

## 2011-07-13 NOTE — ED Provider Notes (Signed)
Medical screening examination/treatment/procedure(s) were performed by non-physician practitioner and as supervising physician I was immediately available for consultation/collaboration.   Joya Gaskins, MD 07/13/11 1850

## 2011-08-22 ENCOUNTER — Encounter (HOSPITAL_COMMUNITY): Payer: Self-pay | Admitting: *Deleted

## 2011-08-22 ENCOUNTER — Emergency Department (HOSPITAL_COMMUNITY)
Admission: EM | Admit: 2011-08-22 | Discharge: 2011-08-22 | Disposition: A | Discharge status: Home / Self Care | Payer: Medicaid Other | Attending: Emergency Medicine | Admitting: Emergency Medicine

## 2011-08-22 DIAGNOSIS — Z87891 Personal history of nicotine dependence: Secondary | ICD-10-CM | POA: Insufficient documentation

## 2011-08-22 DIAGNOSIS — J029 Acute pharyngitis, unspecified: Secondary | ICD-10-CM | POA: Insufficient documentation

## 2011-08-22 MED ORDER — IBUPROFEN 800 MG PO TABS
800.0000 mg | ORAL_TABLET | Freq: Once | ORAL | Status: AC
  Administered 2011-08-22: 800 mg via ORAL
  Filled 2011-08-22: qty 1

## 2011-08-22 MED ORDER — PENICILLIN G BENZATHINE 1200000 UNIT/2ML IM SUSP
1.2000 10*6.[IU] | Freq: Once | INTRAMUSCULAR | Status: AC
  Administered 2011-08-22: 1.2 10*6.[IU] via INTRAMUSCULAR
  Filled 2011-08-22: qty 2

## 2011-08-22 NOTE — Discharge Instructions (Signed)

## 2011-08-22 NOTE — ED Notes (Signed)
Pt alert & oriented x4, stable gait. Pt given discharge instructions, paperwork. Patient instructed to stop at the registration desk to finish any additional paperwork. pt verbalized understanding. Pt left department w/ no further questions.  

## 2011-08-22 NOTE — ED Notes (Signed)
Pt reports sore throat, noticed white spots on throat, states had strep a few months ago.

## 2011-08-22 NOTE — ED Provider Notes (Signed)
History     CSN: [id number]  Arrival date & time 08/22/11  1956   First MD Initiated Contact with Patient 08/22/11 2124      Chief Complaint  Patient presents with  . Sore Throat    (Consider location/radiation/quality/duration/timing/severity/associated sxs/prior treatment) Patient is a 31 y.o. male presenting with pharyngitis. The history is provided by the patient and the spouse.  Sore Throat This is a new problem. The current episode started yesterday. The problem occurs constantly. The problem has been unchanged. Associated symptoms include myalgias and a sore throat. Pertinent negatives include no abdominal pain, arthralgias, chest pain, coughing or neck pain. The symptoms are aggravated by swallowing. He has tried nothing for the symptoms. The treatment provided no relief.    Past Medical History  Diagnosis Date  . Asthma     Past Surgical History  Procedure Date  . Wisdom tooth extraction     History reviewed. No pertinent family history.  History  Substance Use Topics  . Smoking status: Former Smoker -- 1.0 packs/day  . Smokeless tobacco: Not on file  . Alcohol Use: Yes     " a little bit"      Review of Systems  Constitutional: Negative for activity change.       All ROS Neg except as noted in HPI  HENT: Positive for sore throat. Negative for nosebleeds, trouble swallowing and neck pain.   Eyes: Negative for photophobia and discharge.  Respiratory: Negative for cough, shortness of breath and wheezing.   Cardiovascular: Negative for chest pain and palpitations.  Gastrointestinal: Negative for abdominal pain and blood in stool.  Genitourinary: Negative for dysuria, frequency and hematuria.  Musculoskeletal: Positive for myalgias. Negative for back pain and arthralgias.  Skin: Negative.   Neurological: Negative for dizziness, seizures and speech difficulty.  Psychiatric/Behavioral: Negative for hallucinations and confusion.    Allergies  Review of  patient's allergies indicates no known allergies.  Home Medications   Current Outpatient Rx  Name Route Sig Dispense Refill  . DM-DOXYLAMINE-ACETAMINOPHEN 30-12.07-998 MG/30ML PO LIQD Oral Take 30 mLs by mouth daily.    . IBUPROFEN 200 MG PO TABS Oral Take 400 mg by mouth every 6 (six) hours as needed. Pain    . PREDNISONE 10 MG PO TABS  Take 6 tablets day one, 5 tablets day two, 4 tablets day three, 3 tablets day four, 2 tablets day five, then 1 tablet day six 21 tablet 0    BP 107/63  Pulse 63  Temp 98 F (36.7 C) (Oral)  Resp 18  Ht 5\' 6"  (1.676 m)  Wt 165 lb (74.844 kg)  BMI 26.63 kg/m2  SpO2 100%  Physical Exam  Nursing note and vitals reviewed. Constitutional: He is oriented to person, place, and time. He appears well-developed and well-nourished.  Non-toxic appearance.  HENT:  Head: Normocephalic.  Right Ear: Tympanic membrane and external ear normal.  Left Ear: Tympanic membrane and external ear normal.       There is moderate increased redness of the posterior pharynx. The left tonsil is slightly enlarged with some exudate. The uvula is enlarged but midline. The speech is clear.  Eyes: EOM and lids are normal. Pupils are equal, round, and reactive to light.  Neck: Normal range of motion. Neck supple. Carotid bruit is not present.  Cardiovascular: Normal rate, regular rhythm, normal heart sounds, intact distal pulses and normal pulses.   Pulmonary/Chest: Breath sounds normal. No respiratory distress.  Abdominal: Soft. Bowel sounds are normal. There  is no tenderness. There is no guarding.  Musculoskeletal: Normal range of motion.  Lymphadenopathy:       Head (right side): No submandibular adenopathy present.       Head (left side): No submandibular adenopathy present.    He has no cervical adenopathy.  Neurological: He is alert and oriented to person, place, and time. He has normal strength. No cranial nerve deficit or sensory deficit.  Skin: Skin is warm and dry.    Psychiatric: He has a normal mood and affect. His speech is normal.    ED Course  Procedures (including critical care time)  Labs Reviewed - No data to display No results found.   1. Pharyngitis       MDM  I have reviewed nursing notes, vital signs, and all appropriate lab and imaging results for this patient.  Patient treated with Bicillin LA. The patient is advised to use salt water gargles. He is to return if any changes, problems, or concerns.      Kathie Dike, [place] 08/22/11 2134

## 2011-08-22 NOTE — ED Notes (Signed)
Noticed white spots on throat today,  Also pain lt shoulder for mos and getting worse.  No injury

## 2011-08-23 NOTE — ED Provider Notes (Signed)
Medical screening examination/treatment/procedure(s) were performed by non-physician practitioner and as supervising physician I was immediately available for consultation/collaboration.   Alazar Cherian, MD 08/23/11 0005 

## 2011-09-04 ENCOUNTER — Encounter (HOSPITAL_COMMUNITY): Payer: Self-pay

## 2011-09-04 ENCOUNTER — Emergency Department (HOSPITAL_COMMUNITY)
Admission: EM | Admit: 2011-09-04 | Discharge: 2011-09-04 | Discharge status: Against Medical Advice | Payer: Medicaid Other | Attending: Emergency Medicine | Admitting: Emergency Medicine

## 2011-09-04 DIAGNOSIS — R0609 Other forms of dyspnea: Secondary | ICD-10-CM | POA: Insufficient documentation

## 2011-09-04 DIAGNOSIS — R61 Generalized hyperhidrosis: Secondary | ICD-10-CM | POA: Insufficient documentation

## 2011-09-04 DIAGNOSIS — Z87891 Personal history of nicotine dependence: Secondary | ICD-10-CM | POA: Insufficient documentation

## 2011-09-04 DIAGNOSIS — R0989 Other specified symptoms and signs involving the circulatory and respiratory systems: Secondary | ICD-10-CM | POA: Insufficient documentation

## 2011-09-04 DIAGNOSIS — R112 Nausea with vomiting, unspecified: Secondary | ICD-10-CM

## 2011-09-04 DIAGNOSIS — R0600 Dyspnea, unspecified: Secondary | ICD-10-CM

## 2011-09-04 HISTORY — DX: Unspecified convulsions: R56.9

## 2011-09-04 HISTORY — DX: Other seasonal allergic rhinitis: J30.2

## 2011-09-04 MED ORDER — SODIUM CHLORIDE 0.9 % IV SOLN: Freq: Once | INTRAVENOUS | Status: DC

## 2011-09-04 MED ORDER — ONDANSETRON HCL 4 MG/2ML IJ SOLN: 4.0000 mg | Freq: Once | INTRAMUSCULAR | Status: DC

## 2011-09-04 MED ORDER — SODIUM CHLORIDE 0.9 % IV BOLUS (SEPSIS): 1000.0000 mL | Freq: Once | INTRAVENOUS | Status: DC

## 2011-09-04 NOTE — ED Provider Notes (Signed)
History     CSN: [id number]  Arrival date & time 09/04/11  1947   First MD Initiated Contact with Patient 09/04/11 2004      Chief Complaint  Patient presents with  . Shortness of Breath    (Consider location/radiation/quality/duration/timing/severity/associated sxs/prior treatment) Patient is a 31 y.o. male presenting with shortness of breath. The history is provided by the patient.  Shortness of Breath  Associated symptoms include shortness of breath.  He drank a 16 ounce beer and throat after that started running fast because he was in a contest with is brother disease could run faster and farther. He got very hot and sweaty and then vomited. He was having some difficulty breathing initially. EMS was called and that he says that he now feels better. He refused an IV by EMS. He denies hurting anywhere and thinks that he just got overheated. He denies chest pain, heaviness, tightness, pressure. He denies any arthralgias or myalgias.  Past Medical History  Diagnosis Date  . Asthma   . Seasonal allergies   . Seizures     Past Surgical History  Procedure Date  . Wisdom tooth extraction     No family history on file.  History  Substance Use Topics  . Smoking status: Former Smoker -- 1.0 packs/day  . Smokeless tobacco: Not on file  . Alcohol Use: Yes     " a little bit"      Review of Systems  Respiratory: Positive for shortness of breath.   All other systems reviewed and are negative.    Allergies  Review of patient's allergies indicates no known allergies.  Home Medications   Current Outpatient Rx  Name Route Sig Dispense Refill  . DM-DOXYLAMINE-ACETAMINOPHEN 30-12.07-998 MG/30ML PO LIQD Oral Take 30 mLs by mouth daily.    . IBUPROFEN 200 MG PO TABS Oral Take 400 mg by mouth every 6 (six) hours as needed. Pain    . PREDNISONE 10 MG PO TABS  Take 6 tablets day one, 5 tablets day two, 4 tablets day three, 3 tablets day four, 2 tablets day five, then 1 tablet day  six 21 tablet 0    BP 120/64  Pulse 72  Temp 97.6 F (36.4 C) (Oral)  Resp 20  Ht 5\' 6"  (1.676 m)  Wt 162 lb (73.483 kg)  BMI 26.15 kg/m2  SpO2 100%  Physical Exam  Nursing note and vitals reviewed.  31 year old male is resting comfortably and in no acute distress. Vital signs are normal. Oxygen saturation is 100% which is normal. Head is normocephalic and atraumatic. PERRLA, EOMI. Mucous hemorrhage are moist. Neck is nontender and supple. Back is nontender. Lungs are clear without rales, wheezes, rhonchi. Heart has regular rate rhythm without murmur. Abdomen is soft, flat, nontender without masses or hepatosplenomegaly. Extremities have no cyanosis or edema, full range of motion is present. Skin is warm and dry without rash. Neurologic: Mental status is normal, cranial nerves are intact, there are no motor or sensory deficits.  ED Course  Procedures (including critical care time)   1. Dyspnea   2. Nausea and vomiting   3. Diaphoresis       MDM  Acute episode of dyspnea with vomiting and sweating most likely related to overexertion and alcohol consumption. Patient was advised that he would benefit from IV fluids and laboratory workup to be sure that he did not have any laboratory problem or evidence of rhabdomyolysis a. Will lab tests and IVs were ordered but patient refused  and signed out AMA stating that he felt fine and did not feel he needed anything done at this point.        Dione Booze, MD 09/04/11 2023

## 2011-09-04 NOTE — ED Notes (Addendum)
Patient placed on cardiac monitor. Refuses to let me start and IV and draw blood. Refused IV prior to arrival with EMS. States he will not let lab draw blood either. Patient states that he is good now.

## 2011-09-04 NOTE — ED Notes (Signed)
Pt told ems he had been outside all afternoon and got hot, then got sob and nauseated, refused IV per ems request, has vomited en route.

## 2011-09-04 NOTE — ED Notes (Signed)
Patient refuses IV and blood work. States that he feels better and he just wants to leave. Dr. Preston Fleeting notified. Patient signed out AMA.

## 2011-09-10 ENCOUNTER — Emergency Department (HOSPITAL_COMMUNITY)
Admission: EM | Admit: 2011-09-10 | Discharge: 2011-09-10 | Disposition: A | Discharge status: Home / Self Care | Payer: Medicaid Other | Attending: Emergency Medicine | Admitting: Emergency Medicine

## 2011-09-10 ENCOUNTER — Encounter (HOSPITAL_COMMUNITY): Payer: Self-pay | Admitting: *Deleted

## 2011-09-10 DIAGNOSIS — J351 Hypertrophy of tonsils: Secondary | ICD-10-CM

## 2011-09-10 NOTE — ED Notes (Signed)
MD in with patient

## 2011-09-10 NOTE — ED Notes (Signed)
White spots in throat, swelling and burning in throat.  Onset today, Recent strep throat.

## 2011-09-10 NOTE — ED Provider Notes (Cosign Needed)
History     CSN: [id number]  Arrival date & time 09/10/11  1153   First MD Initiated Contact with Patient 09/10/11 1309      Chief Complaint  Patient presents with  . Sore Throat    (Consider location/radiation/quality/duration/timing/severity/associated sxs/prior treatment) Patient is a 31 y.o. male presenting with pharyngitis. The history is provided by the patient (pt complains of "white stuff on tonsils"). No language interpreter was used.  Sore Throat This is a new problem. The current episode started yesterday. The problem occurs constantly. The problem has not changed since onset.Pertinent negatives include no chest pain, no abdominal pain and no headaches. Nothing relieves the symptoms.    Past Medical History  Diagnosis Date  . Asthma   . Seasonal allergies   . Seizures     Past Surgical History  Procedure Date  . Wisdom tooth extraction     History reviewed. No pertinent family history.  History  Substance Use Topics  . Smoking status: Never Smoker   . Smokeless tobacco: Not on file  . Alcohol Use: No     " a little bit"      Review of Systems  Constitutional: Negative for fatigue.  HENT: Negative for congestion, sinus pressure and ear discharge.        White stuff in throat  Eyes: Negative for discharge.  Respiratory: Negative for cough.   Cardiovascular: Negative for chest pain.  Gastrointestinal: Negative for abdominal pain and diarrhea.  Genitourinary: Negative for frequency and hematuria.  Musculoskeletal: Negative for back pain.  Skin: Negative for rash.  Neurological: Negative for seizures and headaches.  Hematological: Negative.   Psychiatric/Behavioral: Negative for hallucinations.    Allergies  Review of patient's allergies indicates no known allergies.  Home Medications  No current outpatient prescriptions on file.  BP 148/66  Pulse 78  Temp 98 F (36.7 C) (Oral)  Resp 20  Ht 5\' 6"  (1.676 m)  Wt 152 lb (68.947 kg)  BMI  24.53 kg/m2  SpO2 100%  Physical Exam  Constitutional: He is oriented to person, place, and time. He appears well-developed.  HENT:  Head: Normocephalic.       Small excudate left tonsil.  No obvious infection  Eyes: Conjunctivae are normal.  Neck: No tracheal deviation present.  Cardiovascular:  No murmur heard. Musculoskeletal: Normal range of motion.  Neurological: He is oriented to person, place, and time.  Skin: Skin is warm.  Psychiatric: He has a normal mood and affect.    ED Course  Procedures (including critical care time)  Labs Reviewed - No data to display No results found.   1. Enlarged tonsils       MDM          Benny Lennert, MD 09/10/11 1353

## 2011-09-10 NOTE — ED Notes (Signed)
Pt c/o a "white spot in his throat this am." Denies sore throat, cough or congestion or fever. Alert and oriented x 3. Skin warm and dry. Color pink. Breath sounds clear and equal bilaterally.

## 2011-09-19 ENCOUNTER — Emergency Department (HOSPITAL_COMMUNITY)
Admission: EM | Admit: 2011-09-19 | Discharge: 2011-09-19 | Disposition: A | Discharge status: Home / Self Care | Payer: Medicaid Other | Source: Home / Self Care

## 2011-09-19 ENCOUNTER — Encounter (HOSPITAL_COMMUNITY): Payer: Self-pay

## 2011-09-19 ENCOUNTER — Emergency Department (HOSPITAL_COMMUNITY)
Admission: EM | Admit: 2011-09-19 | Discharge: 2011-09-20 | Disposition: A | Discharge status: Home / Self Care | Payer: Medicaid Other | Attending: Emergency Medicine | Admitting: Emergency Medicine

## 2011-09-19 ENCOUNTER — Encounter (HOSPITAL_COMMUNITY): Payer: Self-pay | Admitting: *Deleted

## 2011-09-19 DIAGNOSIS — R221 Localized swelling, mass and lump, neck: Secondary | ICD-10-CM | POA: Insufficient documentation

## 2011-09-19 DIAGNOSIS — J029 Acute pharyngitis, unspecified: Secondary | ICD-10-CM | POA: Insufficient documentation

## 2011-09-19 DIAGNOSIS — R220 Localized swelling, mass and lump, head: Secondary | ICD-10-CM | POA: Insufficient documentation

## 2011-09-19 DIAGNOSIS — J45909 Unspecified asthma, uncomplicated: Secondary | ICD-10-CM | POA: Insufficient documentation

## 2011-09-19 NOTE — ED Provider Notes (Signed)
History     CSN: [id number]  Arrival date & time 09/19/11  2308   First MD Initiated Contact with Patient 09/19/11 2319      Chief Complaint  Patient presents with  . Sore Throat    (Consider location/radiation/quality/duration/timing/severity/associated sxs/prior treatment) HPI Comments: Patient c/o sore throat that began yesterday.  States that he noticed "white spot" on the left side of his throat. Describes the throat pain as "scratchy" and worse with swallowing.  He denies cough, fever, vomiting, nasal congestion , difficulty breathing or swallowing, or ear pain.  Patient states this is a recurring problem.  Patient has been seen here multiple times for same.    Patient is a 31 y.o. male presenting with pharyngitis. The history is provided by the patient.  Sore Throat This is a recurrent problem. The current episode started yesterday. The problem occurs constantly. The problem has been unchanged. Associated symptoms include a sore throat. Pertinent negatives include no abdominal pain, arthralgias, chills, congestion, coughing, fever, headaches, myalgias, nausea, neck pain, numbness, rash, swollen glands, vomiting or weakness. The symptoms are aggravated by swallowing. He has tried nothing for the symptoms.    Past Medical History  Diagnosis Date  . Asthma   . Seasonal allergies   . Seizures     Past Surgical History  Procedure Date  . Wisdom tooth extraction     No family history on file.  History  Substance Use Topics  . Smoking status: Never Smoker   . Smokeless tobacco: Not on file  . Alcohol Use: No     " a little bit"      Review of Systems  Constitutional: Negative for fever and chills.  HENT: Positive for sore throat. Negative for ear pain, congestion, facial swelling, sneezing, mouth sores, trouble swallowing, neck pain and dental problem.   Respiratory: Negative for cough.   Gastrointestinal: Negative for nausea, vomiting and abdominal pain.    Musculoskeletal: Negative for myalgias and arthralgias.  Skin: Negative for rash.  Neurological: Negative for dizziness, weakness, numbness and headaches.  Hematological: Negative for adenopathy.  All other systems reviewed and are negative.    Allergies  Review of patient's allergies indicates no known allergies.  Home Medications  No current outpatient prescriptions on file.  BP 122/67  Pulse 76  Temp 98.5 F (36.9 C) (Oral)  Resp 16  Ht 5\' 6"  (1.676 m)  Wt 163 lb (73.936 kg)  BMI 26.31 kg/m2  SpO2 98%  Physical Exam  Nursing note and vitals reviewed. Constitutional: He is oriented to person, place, and time. He appears well-developed and well-nourished. No distress.  HENT:  Head: Normocephalic and atraumatic. No trismus in the jaw.  Right Ear: Tympanic membrane and ear canal normal.  Left Ear: Tympanic membrane and ear canal normal.  Mouth/Throat: Uvula is midline and mucous membranes are normal. No uvula swelling. Posterior oropharyngeal edema and posterior oropharyngeal erythema present. No tonsillar abscesses.         Slight enlargement of the bilateral tonsils.  Single exudate to the left tonsil.  No significant erythema.  Airway clear  Neck: Normal range of motion and phonation normal. Neck supple. No spinous process tenderness and no muscular tenderness present.  Cardiovascular: Normal rate, regular rhythm and normal heart sounds.   Pulmonary/Chest: Effort normal and breath sounds normal.  Musculoskeletal: Normal range of motion.  Lymphadenopathy:    He has no cervical adenopathy.  Neurological: He is alert and oriented to person, place, and time. He exhibits normal  muscle tone. Coordination normal.  Skin: Skin is warm and dry.    ED Course  Procedures (including critical care time)    Results for orders placed during the hospital encounter of 09/19/11  RAPID STREP SCREEN      Component Value Range   Streptococcus, Group A Screen (Direct) NEGATIVE   NEGATIVE       MDM     Patient is alert, non-toxic appearing.  Drinking fluids w/o difficulty.  No cervical lymphadenopathy or trismus.  Multiple ED visits for same.  The patient appears reasonably screened and/or stabilized for discharge and I doubt any other medical condition or other [facility] requiring further screening, evaluation, or treatment in the ED at this time prior to discharge.     Prescribed: Magic mouthwash    Mellisa Arshad L. [place], [place] 09/20/11 1478

## 2011-09-19 NOTE — ED Notes (Signed)
No answer when called 

## 2011-09-19 NOTE — ED Notes (Signed)
Pt concerned that his throat is red and says his tonsils are swollen

## 2011-09-19 NOTE — ED Notes (Signed)
Here earlier today and left because he says he waited too long and was never seen,  States he has "white stuff on back of throat"

## 2011-09-20 MED ORDER — DIPHENHYD-HYDROCORT-NYSTATIN MT SUSP: OROMUCOSAL | Status: DC

## 2011-09-22 NOTE — ED Provider Notes (Signed)
Medical screening examination/treatment/procedure(s) were performed by non-physician practitioner and as supervising physician I was immediately available for consultation/collaboration.  Nicoletta Dress. Colon Branch, MD 09/22/11 [phone]

## 2011-10-03 ENCOUNTER — Encounter (HOSPITAL_COMMUNITY): Payer: Self-pay | Admitting: *Deleted

## 2011-10-03 ENCOUNTER — Emergency Department (HOSPITAL_COMMUNITY)
Admission: EM | Admit: 2011-10-03 | Discharge: 2011-10-03 | Disposition: A | Discharge status: Home / Self Care | Payer: Medicaid Other | Attending: Emergency Medicine | Admitting: Emergency Medicine

## 2011-10-03 DIAGNOSIS — R6889 Other general symptoms and signs: Secondary | ICD-10-CM | POA: Insufficient documentation

## 2011-10-03 DIAGNOSIS — R0989 Other specified symptoms and signs involving the circulatory and respiratory systems: Secondary | ICD-10-CM

## 2011-10-03 MED ORDER — GI COCKTAIL ~~LOC~~
30.0000 mL | Freq: Once | ORAL | Status: AC
  Administered 2011-10-03: 30 mL via ORAL
  Filled 2011-10-03: qty 30

## 2011-10-03 NOTE — ED Notes (Signed)
Pt alert & oriented x4, stable gait. Patient given discharge instructions, paperwork. Patient instructed to stop at the registration desk to finish any additional paperwork. Patient verbalized understanding. Pt left department w/ no further questions.  
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
## 2011-10-03 NOTE — ED Notes (Signed)
Pt states he has some weed stuck in his throat.
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
## 2012-01-19 ENCOUNTER — Encounter (HOSPITAL_COMMUNITY): Payer: Self-pay | Admitting: *Deleted

## 2012-01-19 ENCOUNTER — Emergency Department (HOSPITAL_COMMUNITY): Payer: Medicaid Other

## 2012-01-19 ENCOUNTER — Emergency Department (HOSPITAL_COMMUNITY)
Admission: EM | Admit: 2012-01-19 | Discharge: 2012-01-19 | Disposition: A | Discharge status: Home / Self Care | Payer: Medicaid Other | Attending: Emergency Medicine | Admitting: Emergency Medicine

## 2012-01-19 DIAGNOSIS — J309 Allergic rhinitis, unspecified: Secondary | ICD-10-CM | POA: Insufficient documentation

## 2012-01-19 DIAGNOSIS — G40909 Epilepsy, unspecified, not intractable, without status epilepticus: Secondary | ICD-10-CM | POA: Insufficient documentation

## 2012-01-19 DIAGNOSIS — Y929 Unspecified place or not applicable: Secondary | ICD-10-CM | POA: Insufficient documentation

## 2012-01-19 DIAGNOSIS — J45909 Unspecified asthma, uncomplicated: Secondary | ICD-10-CM | POA: Insufficient documentation

## 2012-01-19 DIAGNOSIS — Y9389 Activity, other specified: Secondary | ICD-10-CM | POA: Insufficient documentation

## 2012-01-19 DIAGNOSIS — IMO0002 Reserved for concepts with insufficient information to code with codable children: Secondary | ICD-10-CM | POA: Insufficient documentation

## 2012-01-19 DIAGNOSIS — M2550 Pain in unspecified joint: Secondary | ICD-10-CM | POA: Insufficient documentation

## 2012-01-19 DIAGNOSIS — S46912A Strain of unspecified muscle, fascia and tendon at shoulder and upper arm level, left arm, initial encounter: Secondary | ICD-10-CM

## 2012-01-19 MED ORDER — IBUPROFEN 600 MG PO TABS: 600.0000 mg | ORAL_TABLET | Freq: Four times a day (QID) | ORAL | Status: AC | PRN
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
## 2012-01-21 NOTE — ED Provider Notes (Signed)
Medical screening examination/treatment/procedure(s) were performed by non-physician practitioner and as supervising physician I was immediately available for consultation/collaboration.   Deangelo Berns L Kyler Germer, MD 01/21/12 1243 

## 2013-12-17 ENCOUNTER — Encounter (HOSPITAL_COMMUNITY): Payer: Self-pay | Admitting: Emergency Medicine

## 2013-12-17 ENCOUNTER — Emergency Department (HOSPITAL_COMMUNITY)
Admission: EM | Admit: 2013-12-17 | Discharge: 2013-12-17 | Disposition: A | Discharge status: Home / Self Care | Payer: Medicaid Other | Attending: Emergency Medicine | Admitting: Emergency Medicine

## 2013-12-17 DIAGNOSIS — M7521 Bicipital tendinitis, right shoulder: Secondary | ICD-10-CM | POA: Diagnosis not present

## 2013-12-17 DIAGNOSIS — M79601 Pain in right arm: Secondary | ICD-10-CM | POA: Diagnosis present

## 2013-12-17 DIAGNOSIS — J45909 Unspecified asthma, uncomplicated: Secondary | ICD-10-CM | POA: Diagnosis not present

## 2013-12-17 MED ORDER — HYDROCODONE-ACETAMINOPHEN 5-325 MG PO TABS: 1.0000 | ORAL_TABLET | ORAL | Status: DC | PRN

## 2013-12-17 MED ORDER — IBUPROFEN 800 MG PO TABS: 800.0000 mg | ORAL_TABLET | Freq: Three times a day (TID) | ORAL | Status: DC

## 2013-12-17 MED ORDER — TRAMADOL HCL 50 MG PO TABS: 50.0000 mg | ORAL_TABLET | Freq: Four times a day (QID) | ORAL | Status: DC | PRN

## 2013-12-17 NOTE — ED Provider Notes (Signed)
CSN: [id number]     Arrival date & time 12/17/13  2102 History  This chart was scribed for Gilda Creasehristopher J. Tatjana Turcott, * by Modena JanskyAlbert Thayil, ED Scribe. This patient was seen in room APA11/APA11 and the patient's care was started at 9:24 PM.   Chief Complaint  Patient presents with  . Arm Pain   The history is provided by the patient. No language interpreter was used.   HPI Comments: Juan Ward is a 33 y.o. male who presents to the Emergency Department complaining of constant moderate right upper arm pain that started this morning. He reports that he may have slept with his arm in a bad position last night. He denies any recent falls or trauma. He states no modifying factors. He reports that he is currently unemployed. He denies any SOB.   Past Medical History  Diagnosis Date  . Asthma   . Seasonal allergies   . Seizures    Past Surgical History  Procedure Laterality Date  . Wisdom tooth extraction     No family history on file. History  Substance Use Topics  . Smoking status: Never Smoker   . Smokeless tobacco: Not on file  . Alcohol Use: No     Comment: " a little bit"    Review of Systems  Allergies  Review of patient's allergies indicates no known allergies.  Home Medications   Prior to Admission medications   Medication Sig Start Date End Date Taking? Authorizing Provider  HYDROcodone-acetaminophen (NORCO/VICODIN) 5-325 MG per tablet Take 1-2 tablets by mouth every 4 (four) hours as needed. 12/17/13   Gilda Creasehristopher J. Mantaj Chamberlin, MD  ibuprofen (ADVIL,MOTRIN) 800 MG tablet Take 1 tablet (800 mg total) by mouth 3 (three) times daily. 12/17/13   Gilda Creasehristopher J. Brandan Robicheaux, MD  traMADol (ULTRAM) 50 MG tablet Take 1 tablet (50 mg total) by mouth every 6 (six) hours as needed. 12/17/13   Gilda Creasehristopher J. Ambry Dix, MD   There were no vitals taken for this visit. Physical Exam  Nursing note and vitals reviewed. Constitutional: He is oriented to person, place, and time. He appears  well-developed and well-nourished. No distress.  HENT:  Head: Normocephalic and atraumatic.  Right Ear: Hearing normal.  Left Ear: Hearing normal.  Nose: Nose normal.  Mouth/Throat: Oropharynx is clear and moist and mucous membranes are normal.  Eyes: Conjunctivae and EOM are normal. Pupils are equal, round, and reactive to light.  Neck: Normal range of motion. Neck supple.  Cardiovascular: Regular rhythm, S1 normal and S2 normal.  Exam reveals no gallop and no friction rub.   No murmur heard. Pulmonary/Chest: Effort normal and breath sounds normal. No respiratory distress. He exhibits no tenderness.  Abdominal: Soft. Normal appearance and bowel sounds are normal. There is no hepatosplenomegaly. There is no tenderness. There is no rebound, no guarding, no tenderness at McBurney's point and negative Murphy's sign. No hernia.  Musculoskeletal: Normal range of motion. He exhibits tenderness.  TTP at the right bicep shoulder tendon. No TTP at the elbow. Normal ROM.   Neurological: He is alert and oriented to person, place, and time. He has normal strength. No cranial nerve deficit or sensory deficit. Coordination normal. GCS eye subscore is 4. GCS verbal subscore is 5. GCS motor subscore is 6.  Skin: Skin is warm, dry and intact. No rash noted. No cyanosis.  Psychiatric: He has a normal mood and affect. His speech is normal and behavior is normal. Thought content normal.    ED Course  Procedures (including  critical care time) DIAGNOSTIC STUDIES:   COORDINATION OF CARE: 9:28 PM- Pt advised of plan for treatment which includes medication and pt agrees.  Labs Review Labs Reviewed - No data to display  Imaging Review No results found.   EKG Interpretation None      MDM   Final diagnoses:  Biceps tendinitis, right   Presents to the ER for evaluation of pain in the right arm. Patient denies any injuries. Patient has point tenderness of the proximal biceps tendon region. There is  pain with motion of the arm. No swelling, no overlying skin changes. Examination is consistent with biceps tendinitis. Treatment with rest, NSAID, analgesia.  I personally performed the services described in this documentation, which was scribed in my presence. The recorded information has been reviewed and is accurate.      Gilda Creasehristopher J. Sherrin Stahle, MD 12/17/13 2154

## 2013-12-17 NOTE — ED Notes (Signed)
Pt alert & oriented x4, stable gait. Patient given discharge instructions, paperwork & prescription(s). Patient  instructed to stop at the registration desk to finish any additional paperwork. Patient verbalized understanding. Pt left department w/ no further questions. 

## 2013-12-17 NOTE — Discharge Instructions (Signed)
Bicipital Tendonitis Bicipital tendonitis refers to redness, soreness, and swelling (inflammation) or irritation of the bicep tendon. The biceps muscle is located between the elbow and shoulder of the inner arm. The tendon heads, similar to pieces of rope, connect the bicep muscle to the shoulder socket. They are called short head and long head tendons. When tendonitis occurs, the long head tendon is inflamed and swollen, and may be thickened or partially torn.  Bicipital tendonitis can occur with other problems as well, such as arthritis in the shoulder or acromioclavicular joints, tears in the tendons, or other rotator cuff problems.  CAUSES  Overuse of of the arms for overhead activities is the major cause of tendonitis. Many athletes, such as swimmers, baseball players, and tennis players are prone to bicipital tendonitis. Jobs that require manual labor or routine chores, especially chores involving overhead activities can result in overuse and tendonitis. SYMPTOMS Symptoms may include:  Pain in and around the front of the shoulder. Pain may be worse with overhead motion.  Pain or aching that radiates down the arm.  Clicking or shifting sensations in the shoulder. DIAGNOSIS Your caregiver may perform the following:  Physical exam and tests of the biceps and shoulder to observe range of motion, strength, and stability.  X-rays or magnetic resonance imaging (MRI) to confirm the diagnosis. In most common cases, these tests are not necessary. Since other problems may exist in the shoulder or rotator cuff, additional tests may be recommended. TREATMENT Treatment may include the following:  Medications  Your caregiver may prescribe over-the-counter pain relievers.  Steroid injections, such as cortisone, may be recommended. These may help to reduce inflammation and pain.  Physical Therapy - Your caregiver may recommend gentle exercises with the arm. These can help restore strength and range  of motion. They may be done at home or with a physical therapist's supervision and input.  Surgery - Arthroscopic or open surgery sometimes is necessary. Surgery may include:  Reattachment or repair of the tendon at the shoulder socket.  Removal of the damaged section of the tendon.  Anchoring the tendon to a different area of the shoulder (tenodesis). HOME CARE INSTRUCTIONS   Avoid overhead motion of the affected arm or any other motion that causes pain.  Take medication for pain as directed. Do not take these for more than 3 weeks, unless directed to do so by your caregiver.  Ice the affected area for 20 minutes at a time, 3-4 times per day. Place a towel on the skin over the painful area and the ice or cold pack over the towel. Do not place ice directly on the skin.  Perform gentle exercises at home as directed. These will increase strength and flexibility. PREVENTION  Modify your activities as much as possible to protect your arm. A physical therapist or sports medicine physician can help you understand options for safe motion.  Avoid repetitive overhead pulling, lifting, reaching, and throwing until your caregiver tells you it is ok to resume these activities. SEEK MEDICAL CARE IF:  Your pain worsens.  You have difficulty moving the affected arm.  You have trouble performing any of the self-care instructions. MAKE SURE YOU:   Understand these instructions.  Will watch your condition.  Will get help right away if you are not doing well or get worse. Document Released: 03/22/2010 Document Revised: 05/12/2011 Document Reviewed: 03/22/2010 Exit[employer] Patient Information 2015 [employer]. This information is not intended to replace advice given to you by your health care provider.   Make sure you discuss any questions you have with your health care provider.  

## 2013-12-17 NOTE — ED Notes (Signed)
Pt states he awoke this morning with pain to his right upper arm, states he thinks he slept on it wrong, has been hurting all day.

## 2013-12-17 NOTE — ED Notes (Signed)
Pt states thinks he might have slept on right arm. Pt has good range of motion. Some pain w/ abduction.

## 2013-12-26 MED FILL — Hydrocodone-Acetaminophen Tab 5-325 MG: ORAL | Qty: 6 | Status: AC

## 2014-10-11 ENCOUNTER — Encounter (HOSPITAL_COMMUNITY): Payer: Self-pay | Admitting: Emergency Medicine

## 2014-10-11 ENCOUNTER — Emergency Department (HOSPITAL_COMMUNITY)
Admission: EM | Admit: 2014-10-11 | Discharge: 2014-10-11 | Disposition: A | Discharge status: Home / Self Care | Payer: Medicaid Other | Attending: Emergency Medicine | Admitting: Emergency Medicine

## 2014-10-11 DIAGNOSIS — J45909 Unspecified asthma, uncomplicated: Secondary | ICD-10-CM | POA: Insufficient documentation

## 2014-10-11 DIAGNOSIS — Z791 Long term (current) use of non-steroidal anti-inflammatories (NSAID): Secondary | ICD-10-CM | POA: Diagnosis not present

## 2014-10-11 DIAGNOSIS — M545 Low back pain, unspecified: Secondary | ICD-10-CM

## 2014-10-11 DIAGNOSIS — Z720 Tobacco use: Secondary | ICD-10-CM | POA: Insufficient documentation

## 2014-10-11 MED ORDER — CYCLOBENZAPRINE HCL 10 MG PO TABS: 10.0000 mg | ORAL_TABLET | Freq: Two times a day (BID) | ORAL | Status: DC | PRN

## 2014-10-11 MED ORDER — IBUPROFEN 800 MG PO TABS
800.0000 mg | ORAL_TABLET | Freq: Three times a day (TID) | ORAL | Status: DC
Start: 2014-10-11 — End: 2015-04-18

## 2014-10-11 NOTE — ED Provider Notes (Signed)
CSN: [id number]     Arrival date & time 10/11/14  1246 History   First MD Initiated Contact with Patient 10/11/14 1302     Chief Complaint  Patient presents with  . Back Pain     (Consider location/radiation/quality/duration/timing/severity/associated sxs/prior Treatment) HPI Comments: Pt comes in with c/o lower back pain that started yesterday after doing some heavy lifting. Took tylenol without relief. Denies numbness, weakness or incontinence. Pain worse with movement.  The history is provided by the patient. No language interpreter was used.    Past Medical History  Diagnosis Date  . Asthma   . Seasonal allergies   . Seizures    Past Surgical History  Procedure Laterality Date  . Wisdom tooth extraction     History reviewed. No pertinent family history. Social History  Substance Use Topics  . Smoking status: Current Every Day Smoker -- 1.00 packs/day    Types: Cigarettes  . Smokeless tobacco: None  . Alcohol Use: No     Comment: " a little bit"    Review of Systems  All other systems reviewed and are negative.     Allergies  Review of patient's allergies indicates no known allergies.  Home Medications   Prior to Admission medications   Medication Sig Start Date End Date Taking? Authorizing Provider  HYDROcodone-acetaminophen (NORCO/VICODIN) 5-325 MG per tablet Take 1-2 tablets by mouth every 4 (four) hours as needed. 12/17/13   Gilda Crease, MD  ibuprofen (ADVIL,MOTRIN) 800 MG tablet Take 1 tablet (800 mg total) by mouth 3 (three) times daily. 12/17/13   Gilda Crease, MD  traMADol (ULTRAM) 50 MG tablet Take 1 tablet (50 mg total) by mouth every 6 (six) hours as needed. 12/17/13   Gilda Crease, MD   BP 133/71 mmHg  Pulse 97  Temp(Src) 98.4 F (36.9 C) (Oral)  Resp 16  Ht  (1.676 m)  Wt 156 lb (70.761 kg)  BMI 25.19 kg/m2  SpO2 100% Physical Exam  Constitutional: He is oriented to person, place, and time. He appears  well-developed and well-nourished.  Cardiovascular: Normal rate and regular rhythm.   Pulmonary/Chest: Effort normal and breath sounds normal.  Musculoskeletal:  Bilateral lumbar paraspinal tenderness. Good sensation and strength to bilateral lower extremities. Pt able to do bilateral straight leg raises  Neurological: He is alert and oriented to person, place, and time. He exhibits normal muscle tone. Coordination normal.  Skin: Skin is warm and dry.  Nursing note and vitals reviewed.   ED Course  Procedures (including critical care time) Labs Review Labs Reviewed - No data to display  Imaging Review No results found.   EKG Interpretation None      MDM   Final diagnoses:  Bilateral low back pain without sciatica    Pt is okay to follow up with ortho as needed. No redflag symptoms. Given flexeril and ibuprofen for symptoms    Teressa Lower, NP 10/11/14 1314  Tilden Fossa, MD 10/11/14 1315

## 2014-10-11 NOTE — ED Notes (Signed)
Patient complaining of back pain since yesterday after lifting heavy equipment at work.

## 2014-10-11 NOTE — Discharge Instructions (Signed)

## 2014-11-07 ENCOUNTER — Encounter (HOSPITAL_COMMUNITY): Payer: Self-pay | Admitting: Emergency Medicine

## 2014-11-07 ENCOUNTER — Emergency Department (HOSPITAL_COMMUNITY)
Admission: EM | Admit: 2014-11-07 | Discharge: 2014-11-07 | Disposition: A | Discharge status: Home / Self Care | Payer: Medicaid Other | Attending: Emergency Medicine | Admitting: Emergency Medicine

## 2014-11-07 ENCOUNTER — Emergency Department (HOSPITAL_COMMUNITY): Payer: Medicaid Other

## 2014-11-07 DIAGNOSIS — Z791 Long term (current) use of non-steroidal anti-inflammatories (NSAID): Secondary | ICD-10-CM | POA: Diagnosis not present

## 2014-11-07 DIAGNOSIS — Z720 Tobacco use: Secondary | ICD-10-CM | POA: Diagnosis not present

## 2014-11-07 DIAGNOSIS — M542 Cervicalgia: Secondary | ICD-10-CM | POA: Insufficient documentation

## 2014-11-07 DIAGNOSIS — J45909 Unspecified asthma, uncomplicated: Secondary | ICD-10-CM | POA: Diagnosis not present

## 2014-11-07 MED ORDER — CYCLOBENZAPRINE HCL 10 MG PO TABS: 10.0000 mg | ORAL_TABLET | Freq: Three times a day (TID) | ORAL | Status: DC | PRN

## 2014-11-07 NOTE — ED Provider Notes (Signed)
CSN: [id number]     Arrival date & time 11/07/14  1221 History   First MD Initiated Contact with Patient 11/07/14 1258     Chief Complaint  Patient presents with  . Neck Pain     (Consider location/radiation/quality/duration/timing/severity/associated sxs/prior Treatment) HPI Comments: Patient is a 34 year old male who presents with complaints of neck pain. This started approximately 2 weeks ago in the absence of any injury or trauma. It is worse when he turns his head rest. There is no radiation into his arms or legs. He denies any bowel or bladder complaints.  Patient is a 34 y.o. male presenting with neck pain. The history is provided by the patient.  Neck Pain Pain location:  R side Quality:  Stabbing and stiffness Stiffness is present:  All day Pain radiates to:  Does not radiate Pain severity:  Moderate Onset quality:  Sudden Duration:  2 weeks Timing:  Constant Progression:  Unchanged Chronicity:  New Context: not fall   Relieved by:  Nothing Worsened by:  Twisting Ineffective treatments:  None tried Associated symptoms: no bladder incontinence, no bowel incontinence, no numbness and no paresis     Past Medical History  Diagnosis Date  . Asthma   . Seasonal allergies   . Seizures    Past Surgical History  Procedure Laterality Date  . Wisdom tooth extraction     No family history on file. Social History  Substance Use Topics  . Smoking status: Current Every Day Smoker -- 1.00 packs/day    Types: Cigarettes  . Smokeless tobacco: None  . Alcohol Use: No     Comment: " a little bit"    Review of Systems  Gastrointestinal: Negative for bowel incontinence.  Genitourinary: Negative for bladder incontinence.  Musculoskeletal: Positive for neck pain.  Neurological: Negative for numbness.  All other systems reviewed and are negative.     Allergies  Review of patient's allergies indicates no known allergies.  Home Medications   Prior to Admission  medications   Medication Sig Start Date End Date Taking? Authorizing Provider  cyclobenzaprine (FLEXERIL) 10 MG tablet Take 1 tablet (10 mg total) by mouth 2 (two) times daily as needed for muscle spasms. 10/11/14   Teressa Lower, NP  HYDROcodone-acetaminophen (NORCO/VICODIN) 5-325 MG per tablet Take 1-2 tablets by mouth every 4 (four) hours as needed. 12/17/13   Gilda Crease, MD  ibuprofen (ADVIL,MOTRIN) 800 MG tablet Take 1 tablet (800 mg total) by mouth 3 (three) times daily. 10/11/14   Teressa Lower, NP  traMADol (ULTRAM) 50 MG tablet Take 1 tablet (50 mg total) by mouth every 6 (six) hours as needed. 12/17/13   Gilda Crease, MD   BP 126/86 mmHg  Pulse 81  Temp(Src) 98.4 F (36.9 C) (Oral)  Resp 18  Ht 5\' 6"  (1.676 m)  Wt 156 lb (70.761 kg)  BMI 25.19 kg/m2  SpO2 100% Physical Exam  Constitutional: He is oriented to person, place, and time. He appears well-developed and well-nourished. No distress.  HENT:  Head: Normocephalic and atraumatic.  The right TM is clear.  Neck: Normal range of motion. Neck supple.  Neurological: He is alert and oriented to person, place, and time.  Strength is 5 out of 5 in the bilateral upper extremities. He is able to flex, extend, and oppose all fingers. Sensation is intact to both hands. Ulnar and radial pulses are easily palpable.  Skin: Skin is warm and dry. He is not diaphoretic.  Nursing note and vitals reviewed.  ED Course  Procedures (including critical care time) Labs Review Labs Reviewed - No data to display  Imaging Review No results found. I have personally reviewed and evaluated these images and lab results as part of my medical decision-making.   EKG Interpretation None      MDM   Final diagnoses:  None    Xrays are negative.  No signs or symptoms that would suggest an emergent situation.  Will treat with nsaids, flexeril, and prn return.    Geoffery Lyons, MD 11/07/14 1414

## 2014-11-07 NOTE — ED Notes (Signed)
Pt states he neck pain x 2 weeks, denies injury, intermittent head, states started have right ear pain yesterday.

## 2014-11-07 NOTE — Discharge Instructions (Signed)
Ibuprofen 600 mg 3 times daily for the next 5 days.  Flexeril as prescribed as needed for pain not relieved with ibuprofen.  Follow up with your primary Dr. if you're not improving in the next week, and return to the ER if symptoms significantly worsen or change.   Musculoskeletal Pain Musculoskeletal pain is muscle and boney aches and pains. These pains can occur in any part of the body. Your caregiver may treat you without knowing the cause of the pain. They may treat you if blood or urine tests, X-rays, and other tests were normal.  CAUSES There is often not a definite cause or reason for these pains. These pains may be caused by a type of germ (virus). The discomfort may also come from overuse. Overuse includes working out too hard when your body is not fit. Boney aches also come from weather changes. Bone is sensitive to atmospheric pressure changes. HOME CARE INSTRUCTIONS   Ask when your test results will be ready. Make sure you get your test results.  Only take over-the-counter or prescription medicines for pain, discomfort, or fever as directed by your caregiver. If you were given medications for your condition, do not drive, operate machinery or power tools, or sign legal documents for 24 hours. Do not drink alcohol. Do not take sleeping pills or other medications that may interfere with treatment.  Continue all activities unless the activities cause more pain. When the pain lessens, slowly resume normal activities. Gradually increase the intensity and duration of the activities or exercise.  During periods of severe pain, bed rest may be helpful. Lay or sit in any position that is comfortable.  Putting ice on the injured area.  Put ice in a bag.  Place a towel between your skin and the bag.  Leave the ice on for 15 to 20 minutes, 3 to 4 times a day.  Follow up with your caregiver for continued problems and no reason can be found for the pain. If the pain becomes worse or does  not go away, it may be necessary to repeat tests or do additional testing. Your caregiver may need to look further for a possible cause. SEEK IMMEDIATE MEDICAL CARE IF:  You have pain that is getting worse and is not relieved by medications.  You develop chest pain that is associated with shortness or breath, sweating, feeling sick to your stomach (nauseous), or throw up (vomit).  Your pain becomes localized to the abdomen.  You develop any new symptoms that seem different or that concern you. MAKE SURE YOU:   Understand these instructions.  Will watch your condition.  Will get help right away if you are not doing well or get worse. Document Released: 02/17/2005 Document Revised: 05/12/2011 Document Reviewed: 10/22/2012 [facility] Patient Information 2015 [place], [place]. This information is not intended to replace advice given to you by your health care provider. Make sure you discuss any questions you have with your health care provider.

## 2015-04-10 ENCOUNTER — Emergency Department (HOSPITAL_COMMUNITY)
Admission: EM | Admit: 2015-04-10 | Discharge: 2015-04-10 | Disposition: A | Discharge status: Home / Self Care | Payer: Medicaid Other | Attending: Emergency Medicine | Admitting: Emergency Medicine

## 2015-04-10 ENCOUNTER — Encounter (HOSPITAL_COMMUNITY): Payer: Self-pay | Admitting: Emergency Medicine

## 2015-04-10 DIAGNOSIS — J45909 Unspecified asthma, uncomplicated: Secondary | ICD-10-CM | POA: Diagnosis not present

## 2015-04-10 DIAGNOSIS — R21 Rash and other nonspecific skin eruption: Secondary | ICD-10-CM | POA: Diagnosis present

## 2015-04-10 DIAGNOSIS — F17210 Nicotine dependence, cigarettes, uncomplicated: Secondary | ICD-10-CM | POA: Diagnosis not present

## 2015-04-10 DIAGNOSIS — Z791 Long term (current) use of non-steroidal anti-inflammatories (NSAID): Secondary | ICD-10-CM | POA: Diagnosis not present

## 2015-04-10 MED ORDER — TRIAMCINOLONE ACETONIDE 0.1 % EX CREA: 1.0000 "application " | TOPICAL_CREAM | Freq: Two times a day (BID) | CUTANEOUS | Status: DC

## 2015-04-10 NOTE — ED Provider Notes (Signed)
CSN: [id number]     Arrival date & time 04/10/15  0600 History   First MD Initiated Contact with Patient 04/10/15 0700     Chief Complaint  Patient presents with  . Rash     Patient is a 35 y.o. male presenting with rash. The history is provided by the patient.  Rash Location:  Torso and leg (Lower abdomen and behind the knees) Quality: dryness, itchiness and scaling   Quality: not blistering, not burning, not painful, not peeling, not red, not swelling and not weeping   Severity:  Unable to specify Duration:  2 months Timing:  Constant Context: not exposure to similar rash, not insect bite/sting, not medications and not sick contacts   Ineffective treatments:  Anti-fungal cream Associated symptoms: no abdominal pain, no fever, no myalgias, no throat swelling, no tongue swelling, not vomiting and not wheezing     Past Medical History  Diagnosis Date  . Asthma   . Seasonal allergies   . Seizures [facility])    Past Surgical History  Procedure Laterality Date  . Wisdom tooth extraction     History reviewed. No pertinent family history. Social History  Substance Use Topics  . Smoking status: Current Every Day Smoker -- 1.00 packs/day    Types: Cigarettes  . Smokeless tobacco: None  . Alcohol Use: No     Comment: " a little bit"    Review of Systems  Constitutional: Negative for fever.  Respiratory: Negative for wheezing.   Gastrointestinal: Negative for vomiting and abdominal pain.  Musculoskeletal: Negative for myalgias.  Skin: Positive for rash.  All other systems reviewed and are negative.     Allergies  Review of patient's allergies indicates no known allergies.  Home Medications   Prior to Admission medications   Medication Sig Start Date End Date Taking? Authorizing Provider  cyclobenzaprine (FLEXERIL) 10 MG tablet Take 1 tablet (10 mg total) by mouth 3 (three) times daily as needed for muscle spasms. 11/07/14   Geoffery Lyons, MD  HYDROcodone-acetaminophen  (NORCO/VICODIN) 5-325 MG per tablet Take 1-2 tablets by mouth every 4 (four) hours as needed. 12/17/13   Gilda Crease, MD  ibuprofen (ADVIL,MOTRIN) 800 MG tablet Take 1 tablet (800 mg total) by mouth 3 (three) times daily. 10/11/14   Teressa Lower, NP  traMADol (ULTRAM) 50 MG tablet Take 1 tablet (50 mg total) by mouth every 6 (six) hours as needed. 12/17/13   Gilda Crease, MD  triamcinolone cream (KENALOG) 0.1 % Apply 1 application topically 2 (two) times daily. 04/10/15   Linwood Dibbles, MD   BP 127/81 mmHg  Pulse 98  Temp(Src) 98 F (36.7 C) (Oral)  Resp 18  Ht  (1.676 m)  Wt 72.576 kg  BMI 25.84 kg/m2  SpO2 100% Physical Exam  Constitutional: He appears well-developed and well-nourished. No distress.  HENT:  Head: Normocephalic and atraumatic.  Right Ear: External ear normal.  Left Ear: External ear normal.  Eyes: Conjunctivae are normal. Right eye exhibits no discharge. Left eye exhibits no discharge. No scleral icterus.  Neck: Neck supple. No tracheal deviation present.  Cardiovascular: Normal rate.   Pulmonary/Chest: Effort normal. No stridor. No respiratory distress.  Musculoskeletal: He exhibits no edema.  Neurological: He is alert. Cranial nerve deficit: no gross deficits.  Skin: Skin is warm and dry. Rash noted. No petechiae noted. Rash is maculopapular. Rash is not vesicular and not urticarial. No erythema.     No rash on palms  Psychiatric: He has a  normal mood and affect.  Nursing note and vitals reviewed.   ED Course  Procedures (including critical care time)   MDM   Final diagnoses:  Rash    ? Atopic dermatitis.  ? Contact at the waist band and behind the knees? Pt has dried skin and areas of excoriation especially behind the knees.  Similar slightly raised area on the lower abdomen with confluent very small papules.  No urticaria.  Follow up with a PCP    Linwood Dibbles, MD 04/10/15 [phone]

## 2015-04-10 NOTE — ED Notes (Signed)
Pt c/o rash to lower abd and bilateral posterior knees x 2 months.

## 2015-04-10 NOTE — Discharge Instructions (Signed)
Eczema Eczema, also called atopic dermatitis, is a skin disorder that causes inflammation of the skin. It causes a red rash and dry, scaly skin. The skin becomes very itchy. Eczema is generally worse during the cooler winter months and often improves with the warmth of summer. Eczema usually starts showing signs in infancy. Some children outgrow eczema, but it may last through adulthood.  CAUSES  The exact cause of eczema is not known, but it appears to run in families. People with eczema often have a family history of eczema, allergies, asthma, or hay fever. Eczema is not contagious. Flare-ups of the condition may be caused by:   Contact with something you are sensitive or allergic to.   Stress. SIGNS AND SYMPTOMS  Dry, scaly skin.   Red, itchy rash.   Itchiness. This may occur before the skin rash and may be very intense.  DIAGNOSIS  The diagnosis of eczema is usually made based on symptoms and medical history. TREATMENT  Eczema cannot be cured, but symptoms usually can be controlled with treatment and other strategies. A treatment plan might include:  Controlling the itching and scratching.   Use over-the-counter antihistamines as directed for itching. This is especially useful at night when the itching tends to be worse.   Use over-the-counter steroid creams as directed for itching.   Avoid scratching. Scratching makes the rash and itching worse. It may also result in a skin infection (impetigo) due to a break in the skin caused by scratching.   Keeping the skin well moisturized with creams every day. This will seal in moisture and help prevent dryness. Lotions that contain alcohol and water should be avoided because they can dry the skin.   Limiting exposure to things that you are sensitive or allergic to (allergens).   Recognizing situations that cause stress.   Developing a plan to manage stress.  HOME CARE INSTRUCTIONS   Only take over-the-counter or  prescription medicines as directed by your health care provider.   Do not use anything on the skin without checking with your health care provider.   Keep baths or showers short (5 minutes) in warm (not hot) water. Use mild cleansers for bathing. These should be unscented. You may add nonperfumed bath oil to the bath water. It is best to avoid soap and bubble bath.   Immediately after a bath or shower, when the skin is still damp, apply a moisturizing ointment to the entire body. This ointment should be a petroleum ointment. This will seal in moisture and help prevent dryness. The thicker the ointment, the better. These should be unscented.   Keep fingernails cut short. Children with eczema may need to wear soft gloves or mittens at night after applying an ointment.   Dress in clothes made of cotton or cotton blends. Dress lightly, because heat increases itching.   A child with eczema should stay away from anyone with fever blisters or cold sores. The virus that causes fever blisters (herpes simplex) can cause a serious skin infection in children with eczema. SEEK MEDICAL CARE IF:   Your itching interferes with sleep.   Your rash gets worse or is not better within 1 week after starting treatment.   You see pus or soft yellow scabs in the rash area.   You have a fever.   You have a rash flare-up after contact with someone who has fever blisters.    This information is not intended to replace advice given to you by your health care   provider. Make sure you discuss any questions you have with your health care provider.   Document Released: 02/15/2000 Document Revised: 12/08/2012 Document Reviewed: 09/20/2012 Elsevier Interactive Patient Education 2016 [employer]  

## 2015-04-10 NOTE — ED Notes (Signed)
Pt made aware to return if symptoms worsen or if any life threatening symptoms occur.   

## 2015-04-18 ENCOUNTER — Emergency Department (HOSPITAL_COMMUNITY)
Admission: EM | Admit: 2015-04-18 | Discharge: 2015-04-18 | Disposition: A | Discharge status: Home / Self Care | Payer: Medicaid Other | Attending: Emergency Medicine | Admitting: Emergency Medicine

## 2015-04-18 ENCOUNTER — Encounter (HOSPITAL_COMMUNITY): Payer: Self-pay | Admitting: Emergency Medicine

## 2015-04-18 DIAGNOSIS — F17210 Nicotine dependence, cigarettes, uncomplicated: Secondary | ICD-10-CM | POA: Insufficient documentation

## 2015-04-18 DIAGNOSIS — J45909 Unspecified asthma, uncomplicated: Secondary | ICD-10-CM | POA: Diagnosis not present

## 2015-04-18 DIAGNOSIS — R21 Rash and other nonspecific skin eruption: Secondary | ICD-10-CM

## 2015-04-18 DIAGNOSIS — Z7952 Long term (current) use of systemic steroids: Secondary | ICD-10-CM | POA: Insufficient documentation

## 2015-04-18 MED ORDER — DEXAMETHASONE 4 MG PO TABS: 4.0000 mg | ORAL_TABLET | Freq: Two times a day (BID) | ORAL | Status: AC

## 2015-04-18 MED ORDER — PREDNISONE 50 MG PO TABS
50.0000 mg | ORAL_TABLET | Freq: Once | ORAL | Status: AC
  Administered 2015-04-18: 50 mg via ORAL
  Filled 2015-04-18: qty 1

## 2015-04-18 MED ORDER — HYDROXYZINE PAMOATE 25 MG PO CAPS: 25.0000 mg | ORAL_CAPSULE | Freq: Three times a day (TID) | ORAL | Status: AC | PRN

## 2015-04-18 MED ORDER — ONDANSETRON HCL 4 MG PO TABS
4.0000 mg | ORAL_TABLET | Freq: Once | ORAL | Status: AC
Start: 2015-04-18 — End: 2015-04-18
  Administered 2015-04-18: 4 mg via ORAL
  Filled 2015-04-18: qty 1

## 2015-04-18 MED ORDER — HYDROXYZINE HCL 25 MG PO TABS
25.0000 mg | ORAL_TABLET | Freq: Once | ORAL | Status: AC
  Administered 2015-04-18: 25 mg via ORAL
  Filled 2015-04-18: qty 1

## 2015-04-18 MED ORDER — LINDANE 1 % EX LOTN: 1.0000 "application " | TOPICAL_LOTION | Freq: Once | CUTANEOUS | Status: AC

## 2015-04-18 NOTE — Discharge Instructions (Signed)
Please wash your sheets and linens daily over the next 3-4 days. Please vacuum your bed and couch daily for 3-4 days. Please use Decadron 2 times daily with a meal, use Vistaril 3 times daily for itching. This medication may cause drowsiness, please use with caution. Please see the dermatology specialist if not improving.

## 2015-04-18 NOTE — ED Notes (Signed)
Pt reports a generalized, itchy rash x 1 weeks. Pt denies any new detergents or medications.

## 2015-04-18 NOTE — ED Provider Notes (Signed)
CSN: [id number]     Arrival date & time 04/18/15  1541 History   First MD Initiated Contact with Patient 04/18/15 1640     Chief Complaint  Patient presents with  . Rash     (Consider location/radiation/quality/duration/timing/severity/associated sxs/prior Treatment) HPI Comments: Patient is a 35 year old male who presents to the emergency department with a complaint of a rash. The patient states that this rash actually started about 2 months ago. It would go away and come back. In the last week the patient has noticed it popping out more. He states that he has noticed it on his hands, his back, and his thighs. The patient states that he his been around someone who has been diagnosed with bedbugs. He's also been on a couch that he was not accustomed to being on. He also reports that he had runny nose and a few cold symptoms last week. His been no high fever reported recently. The rash does not affect his mouth. He states that it itches, but is not painful. There is no weeping noted, and no blistering. There's been no recent changes in medications or diet. The patient denies any exposure to any chemicals or new environments.  Patient is a 35 y.o. male presenting with rash. The history is provided by the patient.  Rash Quality: itchiness and redness   Quality: not blistering, not peeling and not weeping   Severity:  Moderate   Past Medical History  Diagnosis Date  . Asthma   . Seasonal allergies   . Seizures [facility])    Past Surgical History  Procedure Laterality Date  . Wisdom tooth extraction     No family history on file. Social History  Substance Use Topics  . Smoking status: Current Every Day Smoker -- 1.00 packs/day    Types: Cigarettes  . Smokeless tobacco: None  . Alcohol Use: Yes     Comment: " a little bit"    Review of Systems  Skin: Positive for rash.  All other systems reviewed and are negative.     Allergies  Review of patient's allergies indicates no known  allergies.  Home Medications   Prior to Admission medications   Medication Sig Start Date End Date Taking? Authorizing Provider  triamcinolone cream (KENALOG) 0.1 % Apply 1 application topically 2 (two) times daily. 04/10/15  Yes Linwood Dibbles, MD  dexamethasone (DECADRON) 4 MG tablet Take 1 tablet (4 mg total) by mouth 2 (two) times daily with a meal. 04/18/15   Ivery Quale, PA-C  hydrOXYzine (VISTARIL) 25 MG capsule Take 1 capsule (25 mg total) by mouth 3 (three) times daily as needed for itching. 04/18/15   Ivery Quale, PA-C  lindane lotion (KWELL) 1 % Apply 1 application topically once. Apply to skin from the neck to the toes. Leave on for 6 to 8 hours, then shower off. 04/18/15   Ivery Quale, PA-C   BP 130/74 mmHg  Pulse 84  Temp(Src) 97.5 F (36.4 C) (Oral)  Resp 18  Ht  (1.676 m)  Wt 72.576 kg  BMI 25.84 kg/m2  SpO2 100% Physical Exam  Constitutional: He is oriented to person, place, and time. He appears well-developed and well-nourished.  Non-toxic appearance.  HENT:  Head: Normocephalic.  Right Ear: Tympanic membrane and external ear normal.  Left Ear: Tympanic membrane and external ear normal.  Eyes: EOM and lids are normal. Pupils are equal, round, and reactive to light.  Neck: Normal range of motion. Neck supple. Carotid bruit is not present.  Cardiovascular: Normal rate, regular rhythm, normal heart sounds, intact distal pulses and normal pulses.   Pulmonary/Chest: Breath sounds normal. No respiratory distress.  Abdominal: Soft. Bowel sounds are normal. There is no tenderness. There is no guarding.  Musculoskeletal: Normal range of motion.  Lymphadenopathy:       Head (right side): No submandibular adenopathy present.       Head (left side): No submandibular adenopathy present.    He has no cervical adenopathy.  Neurological: He is alert and oriented to person, place, and time. He has normal strength. No cranial nerve deficit or sensory deficit.  Skin: Skin is warm  and dry. Rash noted.  There is a maculopapular rash on the dorsum of the hands, the palmar surface is spared. There is also some on the right and left forearm. There is rash noted on the right and left back, the lower abdomen.  Psychiatric: He has a normal mood and affect. His speech is normal.  Nursing note and vitals reviewed.   ED Course  Procedures (including critical care time) Labs Review Labs Reviewed - No data to display  Imaging Review No results found. I have personally reviewed and evaluated these images and lab results as part of my medical decision-making.   EKG Interpretation None      MDM  The patient has a rash involving multiple sites, including the dorsum of the hands, right and left forearms, the right and left back, and the left thigh. Suspect that this is a viral rash, but the patient has had exposure to bedbugs, and possible body lice. The patient will be treated with both lindane, as well as Decadron and Vistaril. The patient is referred to dermatology if not improving.    Final diagnoses:  Rash    *I have reviewed nursing notes, vital signs, and all appropriate lab and imaging results for this patient.[address], PA-C 04/18/15 1812  Bethann Berkshire, MD 04/18/15 [phone]

## 2015-05-21 ENCOUNTER — Emergency Department (HOSPITAL_COMMUNITY)
Admission: EM | Admit: 2015-05-21 | Discharge: 2015-05-21 | Disposition: A | Discharge status: Home / Self Care | Payer: Medicaid Other | Attending: Emergency Medicine | Admitting: Emergency Medicine

## 2015-05-21 ENCOUNTER — Encounter (HOSPITAL_COMMUNITY): Payer: Self-pay | Admitting: Emergency Medicine

## 2015-05-21 DIAGNOSIS — R112 Nausea with vomiting, unspecified: Secondary | ICD-10-CM | POA: Insufficient documentation

## 2015-05-21 DIAGNOSIS — Z79899 Other long term (current) drug therapy: Secondary | ICD-10-CM | POA: Insufficient documentation

## 2015-05-21 DIAGNOSIS — J45909 Unspecified asthma, uncomplicated: Secondary | ICD-10-CM | POA: Insufficient documentation

## 2015-05-21 DIAGNOSIS — F17210 Nicotine dependence, cigarettes, uncomplicated: Secondary | ICD-10-CM | POA: Insufficient documentation

## 2015-05-21 DIAGNOSIS — R197 Diarrhea, unspecified: Secondary | ICD-10-CM | POA: Diagnosis not present

## 2015-05-21 LAB — COMPREHENSIVE METABOLIC PANEL
ALK PHOS: 90 U/L (ref 38–126)
ALT: 46 U/L (ref 17–63)
AST: 37 U/L (ref 15–41)
Albumin: 4.9 g/dL (ref 3.5–5.0)
Anion gap: 11 (ref 5–15)
BUN: 17 mg/dL (ref 6–20)
CALCIUM: 9.2 mg/dL (ref 8.9–10.3)
CO2: 22 mmol/L (ref 22–32)
CREATININE: 1.18 mg/dL (ref 0.61–1.24)
Chloride: 103 mmol/L (ref 101–111)
GFR calc non Af Amer: >60 mL/min (ref >60)
Glucose, Bld: 131 mg/dL — ABNORMAL HIGH (ref 65–99)
Potassium: 3.3 mmol/L — ABNORMAL LOW (ref 3.5–5.1)
SODIUM: 136 mmol/L (ref 135–145)
Total Bilirubin: 0.9 mg/dL (ref 0.3–1.2)
Total Protein: 8.4 g/dL — ABNORMAL HIGH (ref 6.5–8.1)

## 2015-05-21 LAB — URINE MICROSCOPIC-ADD ON

## 2015-05-21 LAB — CBC WITH DIFFERENTIAL/PLATELET
Basophils Absolute: 0 10*3/uL (ref 0.0–0.1)
Basophils Relative: 0 %
EOS ABS: 0.1 10*3/uL (ref 0.0–0.7)
EOS PCT: 1 %
HCT: 46.9 % (ref 39.0–52.0)
HEMOGLOBIN: 16.3 g/dL (ref 13.0–17.0)
LYMPHS ABS: 0.9 10*3/uL (ref 0.7–4.0)
Lymphocytes Relative: 8 %
MCH: 33.1 pg (ref 26.0–34.0)
MCHC: 34.8 g/dL (ref 30.0–36.0)
MCV: 95.3 fL (ref 78.0–100.0)
MONO ABS: 1.5 10*3/uL — AB (ref 0.1–1.0)
MONOS PCT: 13 %
Neutro Abs: 9.3 10*3/uL — ABNORMAL HIGH (ref 1.7–7.7)
Neutrophils Relative %: 78 %
Platelets: 255 10*3/uL (ref 150–400)
RBC: 4.92 MIL/uL (ref 4.22–5.81)
RDW: 13.3 % (ref 11.5–15.5)
WBC: 11.8 10*3/uL — ABNORMAL HIGH (ref 4.0–10.5)

## 2015-05-21 LAB — URINALYSIS, ROUTINE W REFLEX MICROSCOPIC
BILIRUBIN URINE: NEGATIVE
Glucose, UA: NEGATIVE mg/dL
Ketones, ur: NEGATIVE mg/dL
Leukocytes, UA: NEGATIVE
Nitrite: NEGATIVE
PH: 6 (ref 5.0–8.0)
Protein, ur: 100 mg/dL — AB
SPECIFIC GRAVITY, URINE: 1.03 (ref 1.005–1.030)

## 2015-05-21 MED ORDER — SODIUM CHLORIDE 0.9 % IV BOLUS (SEPSIS)
1000.0000 mL | Freq: Once | INTRAVENOUS | Status: AC
  Administered 2015-05-21: 1000 mL via INTRAVENOUS

## 2015-05-21 MED ORDER — PROMETHAZINE HCL 25 MG/ML IJ SOLN
25.0000 mg | Freq: Once | INTRAMUSCULAR | Status: AC
  Administered 2015-05-21: 25 mg via INTRAVENOUS
  Filled 2015-05-21: qty 1

## 2015-05-21 MED ORDER — ONDANSETRON HCL 4 MG/2ML IJ SOLN
4.0000 mg | Freq: Once | INTRAMUSCULAR | Status: AC
  Administered 2015-05-21: 4 mg via INTRAVENOUS
  Filled 2015-05-21: qty 2

## 2015-05-21 MED ORDER — SODIUM CHLORIDE 0.9 % IV BOLUS (SEPSIS)
1000.0000 mL | Freq: Once | INTRAVENOUS | Status: AC
Start: 2015-05-21 — End: 2015-05-21
  Administered 2015-05-21: 1000 mL via INTRAVENOUS

## 2015-05-21 MED ORDER — ONDANSETRON 8 MG PO TBDP: 8.0000 mg | ORAL_TABLET | Freq: Three times a day (TID) | ORAL | Status: AC | PRN

## 2015-05-21 MED ORDER — PROMETHAZINE HCL 25 MG PO TABS: 25.0000 mg | ORAL_TABLET | Freq: Four times a day (QID) | ORAL | Status: AC | PRN

## 2015-05-21 NOTE — ED Notes (Signed)
Pt refusing labs. States he is afraid of needles.

## 2015-05-21 NOTE — Discharge Instructions (Signed)

## 2015-05-21 NOTE — ED Provider Notes (Signed)
CSN: [id number]     Arrival date & time 05/21/15  1730 History   First MD Initiated Contact with Patient 05/21/15 1958     Chief Complaint  Patient presents with  . Emesis     (Consider location/radiation/quality/duration/timing/severity/associated sxs/prior Treatment) HPI 35 year old male with nausea vomiting and diarrhea. Symptoms began this morning with vomiting. He describes the vomit as greens that he has recently eaten. He has had some loose stooling. He has had some subjective fever. He has not had any shaking chills. He has a sibling with similar symptoms. He denies any abdominal pain. Past Medical History  Diagnosis Date  . Asthma   . Seasonal allergies   . Seizures [facility](HCC)    Past Surgical History  Procedure Laterality Date  . Wisdom tooth extraction    . Hand surgery     No family history on file. Social History  Substance Use Topics  . Smoking status: Current Every Day Smoker -- 1.00 packs/day    Types: Cigarettes  . Smokeless tobacco: None  . Alcohol Use: Yes     Comment: " a little bit"    Review of Systems  All other systems reviewed and are negative.     Allergies  Review of patient's allergies indicates no known allergies.  Home Medications   Prior to Admission medications   Medication Sig Start Date End Date Taking? Authorizing Provider  triamcinolone cream (KENALOG) 0.1 % Apply 1 application topically 2 (two) times daily. 04/10/15  Yes Linwood DibblesJon Knapp, MD  dexamethasone (DECADRON) 4 MG tablet Take 1 tablet (4 mg total) by mouth 2 (two) times daily with a meal. Patient not taking: Reported on 05/21/2015 04/18/15   Ivery QualeHobson Bryant, PA-C  hydrOXYzine (VISTARIL) 25 MG capsule Take 1 capsule (25 mg total) by mouth 3 (three) times daily as needed for itching. Patient not taking: Reported on 05/21/2015 04/18/15   Ivery QualeHobson Bryant, PA-C  lindane lotion (KWELL) 1 % Apply 1 application topically once. Apply to skin from the neck to the toes. Leave on for 6 to 8 hours, then  shower off. Patient not taking: Reported on 05/21/2015 04/18/15   Ivery QualeHobson Bryant, PA-C  ondansetron (ZOFRAN ODT) 8 MG disintegrating tablet Take 1 tablet (8 mg total) by mouth every 8 (eight) hours as needed for nausea or vomiting. 05/21/15   Margarita Grizzleanielle Tanner Yeley, MD  promethazine (PHENERGAN) 25 MG tablet Take 1 tablet (25 mg total) by mouth every 6 (six) hours as needed for nausea or vomiting. 05/21/15   Margarita Grizzleanielle Briawna Carver, MD   BP 127/75 mmHg  Pulse 91  Temp(Src) 98.9 F (37.2 C) (Oral)  Resp 16  Ht 5\' 6"  (1.676 m)  Wt 72.576 kg  BMI 25.84 kg/m2  SpO2 98% Physical Exam  Constitutional: He is oriented to person, place, and time. He appears well-developed and well-nourished. No distress.  Patient initially in bathroom vomiting and having diarrhea on my exam. The exam below was done after patient received IV fluid and antibiotics.  HENT:  Head: Normocephalic and atraumatic.  Right Ear: External ear normal.  Left Ear: External ear normal.  Nose: Nose normal.  Mouth/Throat: Oropharynx is clear and moist.  Eyes: Conjunctivae and EOM are normal. Pupils are equal, round, and reactive to light.  Neck: Normal range of motion. Neck supple.  Cardiovascular: Normal rate, regular rhythm, normal heart sounds and intact distal pulses.   Pulmonary/Chest: Effort normal and breath sounds normal. No respiratory distress. He has no wheezes. He exhibits no tenderness.  Abdominal: Soft. Bowel sounds  are normal. He exhibits no distension and no mass. There is no tenderness. There is no guarding.  Musculoskeletal: Normal range of motion.  Neurological: He is alert and oriented to person, place, and time. He has normal reflexes. He exhibits normal muscle tone. Coordination normal.  Skin: Skin is warm and dry.  Psychiatric: He has a normal mood and affect. His behavior is normal. Judgment and thought content normal.  Nursing note and vitals reviewed.   ED Course  Procedures (including critical care time) Labs Review Labs  Reviewed  CBC WITH DIFFERENTIAL/PLATELET - Abnormal; Notable for the following:    WBC 11.8 (*)    Neutro Abs 9.3 (*)    Monocytes Absolute 1.5 (*)    All other components within normal limits  COMPREHENSIVE METABOLIC PANEL - Abnormal; Notable for the following:    Potassium 3.3 (*)    Glucose, Bld 131 (*)    Total Protein 8.4 (*)    All other components within normal limits  URINALYSIS, ROUTINE W REFLEX MICROSCOPIC (NOT AT [facility])    Imaging Review No results found. I have personally reviewed and evaluated these images and lab results as part of my medical decision-making.   EKG Interpretation None      MDM   Final diagnoses:  Nausea vomiting and diarrhea        Margarita Grizzle, MD 05/21/15 2133

## 2015-05-21 NOTE — ED Notes (Signed)
Pt reports generalized abdominal pain, vomiting, and diarrhea that began today.

## 2015-06-07 ENCOUNTER — Emergency Department (HOSPITAL_COMMUNITY)
Admission: EM | Admit: 2015-06-07 | Discharge: 2015-06-07 | Disposition: A | Discharge status: Home / Self Care | Payer: Medicaid Other | Attending: Emergency Medicine | Admitting: Emergency Medicine

## 2015-06-07 ENCOUNTER — Encounter (HOSPITAL_COMMUNITY): Payer: Self-pay

## 2015-06-07 DIAGNOSIS — L235 Allergic contact dermatitis due to other chemical products: Secondary | ICD-10-CM | POA: Insufficient documentation

## 2015-06-07 DIAGNOSIS — J45909 Unspecified asthma, uncomplicated: Secondary | ICD-10-CM | POA: Insufficient documentation

## 2015-06-07 DIAGNOSIS — L309 Dermatitis, unspecified: Secondary | ICD-10-CM

## 2015-06-07 DIAGNOSIS — F17210 Nicotine dependence, cigarettes, uncomplicated: Secondary | ICD-10-CM | POA: Diagnosis not present

## 2015-06-07 DIAGNOSIS — R21 Rash and other nonspecific skin eruption: Secondary | ICD-10-CM | POA: Diagnosis present

## 2015-06-07 MED ORDER — TRIAMCINOLONE ACETONIDE 0.1 % EX CREA: 1.0000 "application " | TOPICAL_CREAM | Freq: Two times a day (BID) | CUTANEOUS | Status: AC

## 2015-06-07 NOTE — ED Notes (Signed)
Dr Knapp at bedside,  

## 2015-06-07 NOTE — ED Provider Notes (Signed)
CSN: [id number]     Arrival date & time 06/07/15  4098 History   First MD Initiated Contact with Patient 06/07/15 [phone]    Chief Complaint  Patient presents with  . Rash     (Consider location/radiation/quality/duration/timing/severity/associated sxs/prior Treatment) HPI patient states that he has been having a rash that comes and goes "for some months". He states he thinks it has started before Christmas but not before Labor Day. 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 He states he's never had it before this past few months. He states he has rash on both of his hands, his left wrist, his left chest, and behind his knees. He states it itches. He denies wearing jewelry, he states he does "by the cheap detergent"and thinks he is using something different now than he use a year. He's been seen in the ED for the same rash twice in February. He does have a history of asthma and states he was told it might be eczema.  PCP none   Past Medical History  Diagnosis Date  . Asthma   . Seasonal allergies   . Seizures [facility])    Past Surgical History  Procedure Laterality Date  . Wisdom tooth extraction    . Hand surgery     No family history on file. Social History  Substance Use Topics  . Smoking status: Current Every Day Smoker -- 1.00 packs/day    Types: Cigarettes  . Smokeless tobacco: None  . Alcohol Use: Yes     Comment: " a little bit"  unemployed Smokes 1/2 ppd  Review of Systems  All other systems reviewed and are negative.     Allergies  Review of patient's allergies indicates no known allergies.  Home Medications   Prior to Admission medications   Medication Sig Start Date End Date Taking? Authorizing Provider  dexamethasone (DECADRON) 4 MG tablet Take 1 tablet (4 mg total) by mouth 2 (two) times daily with a meal. Patient not taking: Reported on 05/21/2015  04/18/15   Ivery Quale, PA-C  hydrOXYzine (VISTARIL) 25 MG capsule Take 1 capsule (25 mg total) by mouth 3 (three) times daily as needed for itching. Patient not taking: Reported on 05/21/2015 04/18/15   Ivery Quale, PA-C  lindane lotion (KWELL) 1 % Apply 1 application topically once. Apply to skin from the neck to the toes. Leave on for 6 to 8 hours, then shower off. Patient not taking: Reported on 05/21/2015 04/18/15   Ivery Quale, PA-C  ondansetron (ZOFRAN ODT) 8 MG disintegrating tablet Take 1 tablet (8 mg total) by mouth every 8 (eight) hours as needed for nausea or vomiting. 05/21/15   Margarita Grizzle, MD  promethazine (PHENERGAN) 25 MG tablet Take 1 tablet (25 mg total) by mouth every 6 (six) hours as needed for nausea or vomiting. 05/21/15   Margarita Grizzle, MD  triamcinolone cream (KENALOG) 0.1 % Apply 1 application topically 2 (two) times daily. 06/07/15   Devoria Albe, MD   BP 117/66 mmHg  Pulse 105  Temp(Src) 98 F (36.7 C) (Oral)  Resp 20  Ht  (1.676 m)  Wt 160 lb (72.576 kg)  BMI 25.84 kg/m2  SpO2 100%  Vital signs normal except for tachycardia  Physical Exam  Constitutional: He is oriented to person, place, and time. He appears well-developed and well-nourished.  Non-toxic appearance. He does not appear ill.  No distress.  HENT:  Head: Normocephalic and atraumatic.  Right Ear: External ear normal.  Left Ear: External ear normal.  Nose: Nose normal. No mucosal edema or rhinorrhea.  Mouth/Throat: Mucous membranes are normal. No dental abscesses or uvula swelling.  Eyes: Conjunctivae and EOM are normal. Pupils are equal, round, and reactive to light.  Neck: Normal range of motion and full passive range of motion without pain.  Pulmonary/Chest: Effort normal. No respiratory distress. He has no rhonchi. He exhibits no crepitus.  Abdominal: Normal appearance.  Musculoskeletal: Normal range of motion. He exhibits no edema or tenderness.  Moves all extremities well.   Neurological:  He is alert and oriented to person, place, and time. He has normal strength. No cranial nerve deficit.  Skin: Skin is warm, dry and intact. Rash noted. No erythema. No pallor.  Patient is noted to have round plaque-like hyperpigmented areas on the dorsum of both his hands, on the ulnar aspect of the volar wrist on the left, and a very large area on his left chest. There is no drainage from the lesions. Some of the areas have some scaliness. Others have some small vesicular clear fluid-filled lesions around the edges.  Psychiatric: He has a normal mood and affect. His speech is normal and behavior is normal. His mood appears not anxious.  Nursing note and vitals reviewed.      ED Course  Procedures (including critical care time) We discussed changing his laundry detergent to a sensitive skin or unscented formulation. He was given another prescription for steroid cream however he was also advised he could take medication over-the-counter.     MDM   Final diagnoses:  Dermatitis   Modified Medications   Modified Medication Previous Medication   TRIAMCINOLONE CREAM (KENALOG) 0.1 % triamcinolone cream (KENALOG) 0.1 %      Apply 1 application topically 2 (two) times daily.    Apply 1 application topically 2 (two) times daily.    Plan discharge  Devoria AlbeIva Francelia Mclaren, MD, Concha PyoFACEP      Thijs Brunton, MD 06/07/15 [phone]

## 2015-06-07 NOTE — ED Notes (Signed)
Pt c/o rash to various areas of body with itching that has been intermittent for "awhile" was seen in er and given a "cream" but has ran out and was not able to follow up with dermatologist,

## 2015-06-07 NOTE — ED Notes (Signed)
Pt c/o rash to multiple areas of his body, states he was treated for same here previously, told it was eczema.  Pt states he has run out of his topical medication

## 2015-06-07 NOTE — Discharge Instructions (Signed)
Avoid taking a long hot shower, this will dry out your skin. Use lubriderm lotion on the rash to keep it from drying out. Use the ointment on the rash as prescribed. Consider seeing a dermatologist if it isn't resolving. Dr Margo AyeHall, a dermatologist [place] an [place] in [place].  Call his office to get an appointment.

## 2015-07-07 ENCOUNTER — Emergency Department (HOSPITAL_COMMUNITY)
Admission: EM | Admit: 2015-07-07 | Discharge: 2015-07-07 | Disposition: A | Discharge status: Home / Self Care | Payer: Medicaid Other | Attending: Emergency Medicine | Admitting: Emergency Medicine

## 2015-07-07 ENCOUNTER — Encounter (HOSPITAL_COMMUNITY): Payer: Self-pay | Admitting: Emergency Medicine

## 2015-07-07 DIAGNOSIS — Z79899 Other long term (current) drug therapy: Secondary | ICD-10-CM | POA: Diagnosis not present

## 2015-07-07 DIAGNOSIS — B86 Scabies: Secondary | ICD-10-CM

## 2015-07-07 DIAGNOSIS — R21 Rash and other nonspecific skin eruption: Secondary | ICD-10-CM | POA: Diagnosis present

## 2015-07-07 DIAGNOSIS — J45909 Unspecified asthma, uncomplicated: Secondary | ICD-10-CM | POA: Insufficient documentation

## 2015-07-07 DIAGNOSIS — L309 Dermatitis, unspecified: Secondary | ICD-10-CM | POA: Diagnosis not present

## 2015-07-07 DIAGNOSIS — F17210 Nicotine dependence, cigarettes, uncomplicated: Secondary | ICD-10-CM | POA: Diagnosis not present

## 2015-07-07 MED ORDER — PREDNISONE 10 MG PO TABS: ORAL_TABLET | ORAL | Status: AC

## 2015-07-07 MED ORDER — PERMETHRIN 5 % EX CREA: TOPICAL_CREAM | CUTANEOUS | Status: AC

## 2015-07-07 NOTE — Discharge Instructions (Signed)
Scabies, Adult Scabies is a skin condition that happens when very small insects get under the skin (infestation). This causes a rash and severe itchiness. Scabies can spread from person to person (is contagious). If you get scabies, it is common for others in your household to get scabies too. With proper treatment, symptoms usually go away in 2-4 weeks. Scabies usually does not cause lasting problems. CAUSES This condition is caused by mites (Sarcoptes scabiei, or human itch mites) that can only be seen with a microscope. The mites get into the top layer of skin and lay eggs. Scabies can spread from person to person through:  Close contact with a person who has scabies.  Contact with infested items, such as towels, bedding, or clothing. RISK FACTORS This condition is more likely to develop in:  People who live in nursing homes and other extended-care facilities.  People who have sexual contact with a partner who has scabies.  Young children who attend child care facilities.  People who care for others who are at increased risk for scabies. SYMPTOMS Symptoms of this condition may include:  Severe itchiness. This is often worse at night.  A rash that includes tiny red bumps or blisters. The rash commonly occurs on the wrist, elbow, armpit, fingers, waist, groin, or buttocks. Bumps may form a line (burrow) in some areas.  Skin irritation. This can include scaly patches or sores. DIAGNOSIS This condition is diagnosed with a physical exam. Your health care provider will look closely at your skin. In some cases, your health care provider may take a sample of your affected skin (skin scraping) and have it examined under a microscope. TREATMENT This condition may be treated with:  Medicated cream or lotion that kills the mites. This is spread on the entire body and left on for several hours. Usually, one treatment with medicated cream or lotion is enough to kill all of the mites. In severe  cases, the treatment may be repeated.  Medicated cream that relieves itching.  Medicines that help to relieve itching.  Medicines that kill the mites. This treatment is rarely used. HOME CARE INSTRUCTIONS Medicines  Take or apply over-the-counter and prescription medicines as told by your health care provider.  Apply medicated cream or lotion as told by your health care provider.  Do not wash off the medicated cream or lotion until the necessary amount of time has passed. Skin Care  Avoid scratching your affected skin.  Keep your fingernails closely trimmed to reduce injury from scratching.  Take cool baths or apply cool washcloths to help reduce itching. General Instructions  Clean all items that you recently had contact with, including bedding, clothing, and furniture. Do this on the same day that your treatment starts.  Use hot water when you wash items.  Place unwashable items into closed, airtight plastic bags for at least 3 days. The mites cannot live for more than 3 days away from human skin.  Vacuum furniture and mattresses that you use.  Make sure that other people who may have been infested are examined by a health care provider. These include members of your household and anyone who may have had contact with infested items.  Keep all follow-up visits as told by your health care provider. This is important. SEEK MEDICAL CARE IF:  You have itching that does not go away after 4 weeks of treatment.  You continue to develop new bumps or burrows.  You have redness, swelling, or pain in your rash area  after treatment.  You have fluid, blood, or pus coming from your rash.   This information is not intended to replace advice given to you by your health care provider. Make sure you discuss any questions you have with your health care provider.   Document Released: 11/08/2014 Document Reviewed: 09/19/2014 Elsevier Interactive Patient Education 2016 Elsevier  Inc.  Hand Dermatitis Hand dermatitis (dyshidrotic eczema) is a skin condition in which small, itchy, raised dots or fluid-filled blisters form over the palms of the hands. Outbreaks of hand dermatitis can last 3 to 4 weeks. CAUSES  The cause of hand dermatitis is unknown. However, it occurs most often in patients with a history of allergies such as:  Hay fever.  Allergic asthma.  Allergies to latex. Chemical exposure, injuries, and environmental irritants can make hand dermatitis worse. Washing your hands too frequently can remove natural oils, which can dry out the skin and contribute to outbreaks of hand dermatitis. SYMPTOMS  The most common symptom of hand dermatitis is intense itching. Cracks or grooves (fissures) on the fingers can also develop. Affected areas can be painful, especially areas where large blisters have formed. DIAGNOSIS Your caregiver can usually tell what the problem is by doing a physical exam. PREVENTION  Avoid excessive hand washing.  Avoid the use of harsh chemicals.  Wear protective gloves when handling products that can irritate your skin. TREATMENT  Steroid creams and ointments, such as over-the-counter 1% hydrocortisone cream, can reduce inflammation and improve moisture retention. These should be applied at least 2 to 4 times per day. Your caregiver may ask you to use a stronger prescription steroid cream to help speed the healing of blistered and cracked skin. In severe cases, oral steroid medicine may be needed. If you have an infection, antibiotics may be needed. Your caregiver may also prescribe antihistamines. These medicines help reduce itching. HOME CARE INSTRUCTIONS  Only take over-the-counter or prescription medicines as directed by your caregiver.  You may use wet or cold compresses. This can help:  Alleviate itching.  Increase the effectiveness of topical creams.  Minimize blisters. SEEK MEDICAL CARE IF:  The rash is not better after 1  week of treatment.  Signs of infection develop, such as redness, tenderness, or yellowish-white fluid (pus).  The rash is spreading.   This information is not intended to replace advice given to you by your health care provider. Make sure you discuss any questions you have with your health care provider.   Document Released: 02/17/2005 Document Revised: 05/12/2011 Document Reviewed: 09/01/2014 Elsevier Interactive Patient Education [employer]2016 Elsevier Inc.

## 2015-07-07 NOTE — ED Provider Notes (Signed)
CSN: [id number]     Arrival date & time 07/07/15  1416 History   First MD Initiated Contact with Patient 07/07/15 1439     Chief Complaint  Patient presents with  . Rash     (Consider location/radiation/quality/duration/timing/severity/associated sxs/prior Treatment) Patient is a 35 y.o. male presenting with rash. The history is provided by the patient. No language interpreter was used.  Rash Location:  Full body Quality: blistering, itchiness and redness   Severity:  Moderate Onset quality:  Gradual Timing:  Constant Progression:  Worsening Chronicity:  New Context: not sick contacts   Relieved by:  Nothing Worsened by:  Nothing tried Ineffective treatments:  None tried Associated symptoms: no nausea     Past Medical History  Diagnosis Date  . Asthma   . Seasonal allergies   . Seizures [facility])    Past Surgical History  Procedure Laterality Date  . Wisdom tooth extraction    . Hand surgery     History reviewed. No pertinent family history. Social History  Substance Use Topics  . Smoking status: Current Every Day Smoker -- 1.00 packs/day    Types: Cigarettes  . Smokeless tobacco: None  . Alcohol Use: Yes     Comment: " a little bit"    Review of Systems  Gastrointestinal: Negative for nausea.  Skin: Positive for rash.  All other systems reviewed and are negative.     Allergies  Review of patient's allergies indicates no known allergies.  Home Medications   Prior to Admission medications   Medication Sig Start Date End Date Taking? Authorizing Provider  calamine lotion Apply 1 application topically as needed for itching (rash).   Yes Historical Provider, MD  triamcinolone cream (KENALOG) 0.1 % Apply 1 application topically 2 (two) times daily. 06/07/15  Yes Devoria Albe, MD  dexamethasone (DECADRON) 4 MG tablet Take 1 tablet (4 mg total) by mouth 2 (two) times daily with a meal. Patient not taking: Reported on 05/21/2015 04/18/15   Ivery Quale, PA-C   hydrOXYzine (VISTARIL) 25 MG capsule Take 1 capsule (25 mg total) by mouth 3 (three) times daily as needed for itching. Patient not taking: Reported on 05/21/2015 04/18/15   Ivery Quale, PA-C  lindane lotion (KWELL) 1 % Apply 1 application topically once. Apply to skin from the neck to the toes. Leave on for 6 to 8 hours, then shower off. Patient not taking: Reported on 05/21/2015 04/18/15   Ivery Quale, PA-C  ondansetron (ZOFRAN ODT) 8 MG disintegrating tablet Take 1 tablet (8 mg total) by mouth every 8 (eight) hours as needed for nausea or vomiting. Patient not taking: Reported on 07/07/2015 05/21/15   Margarita Grizzle, MD  permethrin (ELIMITE) 5 % cream Apply to affected area once 07/07/15   Elson Areas, PA-C  predniSONE (DELTASONE) 10 MG tablet 4,0,9,8,1,1 taper 07/07/15   Elson Areas, PA-C  promethazine (PHENERGAN) 25 MG tablet Take 1 tablet (25 mg total) by mouth every 6 (six) hours as needed for nausea or vomiting. Patient not taking: Reported on 07/07/2015 05/21/15   Margarita Grizzle, MD   BP 134/77 mmHg  Pulse 77  Temp(Src) 97.8 F (36.6 C) (Oral)  Resp 16  Ht  (1.676 m)  Wt 72.576 kg  BMI 25.84 kg/m2  SpO2 100% Physical Exam  Constitutional: He appears well-developed.  HENT:  Head: Normocephalic.  Right Ear: External ear normal.  Left Ear: External ear normal.  Nose: Nose normal.  Mouth/Throat: Oropharynx is clear and moist.  Eyes: Conjunctivae  are normal. Pupils are equal, round, and reactive to light.  Neck: Normal range of motion.  Cardiovascular: Normal rate.   Musculoskeletal:  Blisters hands and arms,  Dried dark skin lesions back and legs,  Neurological: He is alert.  Skin: Skin is warm.  Psychiatric: He has a normal mood and affect.  Nursing note and vitals reviewed.   ED Course  Procedures (including critical care time) Labs Review Labs Reviewed  HIV ANTIBODY (ROUTINE TESTING)  RPR    Imaging Review No results found. I have personally reviewed and  evaluated these images and lab results as part of my medical decision-making.   EKG Interpretation None      MDM I am suspicious of scabies on top of chronic eczema.   Dr. Ranae Palmsyelverton in to see.  I also will obtain hiv and rpr.    Final diagnoses:  Scabies  Eczema    Meds ordered this encounter  Medications  . calamine lotion    Sig: Apply 1 application topically as needed for itching (rash).  . permethrin (ELIMITE) 5 % cream    Sig: Apply to affected area once    Dispense:  60 g    Refill:  1    Order Specific Question:  Supervising Provider    Answer:  MILLER, BRIAN [[id number]]  . predniSONE (DELTASONE) 10 MG tablet    Sig: 6,5,4,3,2,1 taper    Dispense:  21 tablet    Refill:  0    Order Specific Question:  Supervising Provider    Answer:  Eber HongMILLER, BRIAN [[id number]]  An After Visit Summary was printed and given to the patient.    Lonia SkinnerLeslie K RussellvilleSofia, PA-C 07/07/15 1504  Loren Raceravid Yelverton, MD 07/11/15 [phone]

## 2015-07-07 NOTE — ED Notes (Signed)
Pt has a generalized rash, especially on hands and feet.

## 2015-07-09 LAB — RPR: RPR Ser Ql: NONREACTIVE

## 2015-07-09 LAB — HIV ANTIBODY (ROUTINE TESTING W REFLEX): HIV Screen 4th Generation wRfx: NONREACTIVE

## 2016-12-30 ENCOUNTER — Emergency Department (HOSPITAL_COMMUNITY)
Admission: EM | Admit: 2016-12-30 | Discharge: 2016-12-31 | Disposition: A | Discharge status: Home / Self Care | Payer: Medicaid Other | Attending: Emergency Medicine | Admitting: Emergency Medicine

## 2016-12-30 DIAGNOSIS — M25561 Pain in right knee: Secondary | ICD-10-CM | POA: Diagnosis present

## 2016-12-30 DIAGNOSIS — W1789XA Other fall from one level to another, initial encounter: Secondary | ICD-10-CM | POA: Diagnosis not present

## 2016-12-30 DIAGNOSIS — F17210 Nicotine dependence, cigarettes, uncomplicated: Secondary | ICD-10-CM | POA: Diagnosis not present

## 2016-12-30 DIAGNOSIS — W19XXXA Unspecified fall, initial encounter: Secondary | ICD-10-CM

## 2016-12-30 DIAGNOSIS — Z79899 Other long term (current) drug therapy: Secondary | ICD-10-CM | POA: Diagnosis not present

## 2016-12-30 DIAGNOSIS — Y92008 Other place in unspecified non-institutional (private) residence as the place of occurrence of the external cause: Secondary | ICD-10-CM | POA: Insufficient documentation

## 2016-12-30 DIAGNOSIS — Y999 Unspecified external cause status: Secondary | ICD-10-CM | POA: Diagnosis not present

## 2016-12-30 DIAGNOSIS — J45909 Unspecified asthma, uncomplicated: Secondary | ICD-10-CM | POA: Insufficient documentation

## 2016-12-30 DIAGNOSIS — Y9389 Activity, other specified: Secondary | ICD-10-CM | POA: Insufficient documentation

## 2016-12-31 ENCOUNTER — Emergency Department (HOSPITAL_COMMUNITY): Payer: Medicaid Other

## 2016-12-31 ENCOUNTER — Encounter (HOSPITAL_COMMUNITY): Payer: Self-pay | Admitting: *Deleted

## 2016-12-31 NOTE — Discharge Instructions (Signed)
Alternate 600 mg of ibuprofen and [phone] mg of Tylenol every 3 hours as needed for pain. Do not exceed 4000 mg of Tylenol daily.  Apply ice or heat for comfort.  Keep the knee elevated when you are not walking on it.  Use crutches and knee sleeve to get around.  Do some gentle stretching of the knee after hot showers or hot baths to avoid stiffness.  Follow-up with a primary care physician or orthopedist if your symptoms persist.  Return to the ED for any concerning signs or symptoms develop.

## 2016-12-31 NOTE — ED Triage Notes (Signed)
Pt states he fell off a porch landing on his right knee.

## 2016-12-31 NOTE — ED Provider Notes (Signed)
[facility] EMERGENCY DEPARTMENT Provider Note   CSN: [id number] Arrival date & time: 12/30/16  2357     History   Chief Complaint Chief Complaint  Patient presents with  . Knee Pain    HPI Juan Ward is a 36 y.o. male who presents today with chief complaint acute onset, constant throbbing right knee pain secondary to fall earlier this evening.  Patient states that he fell forward off of his porch landing on his right knee.  He denies head injury or loss of consciousness.  States pain is localized just inferior to the patella laterally and does not radiate.  Pain worsens with palpation and movement as well as attempting to sleep prone.  He is able to bear weight on the extremity although it is painful.  Denies numbness, tingling, or weakness.  Has not tried anything for his symptoms prior to arrival.  The history is provided by the patient.    Past Medical History:  Diagnosis Date  . Asthma   . Seasonal allergies   . Seizures (HCC)     There are no active problems to display for this patient.   Past Surgical History:  Procedure Laterality Date  . HAND SURGERY    . WISDOM TOOTH EXTRACTION         Home Medications    Prior to Admission medications   Medication Sig Start Date End Date Taking? Authorizing Provider  calamine lotion Apply 1 application topically as needed for itching (rash).    [provider]  dexamethasone (DECADRON) 4 MG tablet Take 1 tablet (4 mg total) by mouth 2 (two) times daily with a meal. Patient not taking: Reported on 05/21/2015 04/18/15   Ivery Quale, PA-C  hydrOXYzine (VISTARIL) 25 MG capsule Take 1 capsule (25 mg total) by mouth 3 (three) times daily as needed for itching. Patient not taking: Reported on 05/21/2015 04/18/15   Ivery Quale, PA-C  lindane lotion (KWELL) 1 % Apply 1 application topically once. Apply to skin from the neck to the toes. Leave on for 6 to 8 hours, then shower off. Patient not taking: Reported on  05/21/2015 04/18/15   Ivery Quale, PA-C  ondansetron (ZOFRAN ODT) 8 MG disintegrating tablet Take 1 tablet (8 mg total) by mouth every 8 (eight) hours as needed for nausea or vomiting. Patient not taking: Reported on 07/07/2015 05/21/15   Margarita Grizzle, MD  permethrin Verner Mould) 5 % cream Apply to affected area once 07/07/15   Elson Areas, PA-C  predniSONE (DELTASONE) 10 MG tablet 4,0,9,8,1,1 taper 07/07/15   Elson Areas, PA-C  promethazine (PHENERGAN) 25 MG tablet Take 1 tablet (25 mg total) by mouth every 6 (six) hours as needed for nausea or vomiting. Patient not taking: Reported on 07/07/2015 05/21/15   Margarita Grizzle, MD  triamcinolone cream (KENALOG) 0.1 % Apply 1 application topically 2 (two) times daily. 06/07/15   Devoria Albe, MD    Family History No family history on file.  Social History Social History  Substance Use Topics  . Smoking status: Current Every Day Smoker    Packs/day: 1.00    Types: Cigarettes  . Smokeless tobacco: Current User  . Alcohol use Yes     Comment: " a little bit"     Allergies   Patient has no known allergies.   Review of Systems Review of Systems  Musculoskeletal: Positive for arthralgias (Right knee). Negative for back pain and neck pain.  Neurological: Negative for syncope, weakness, numbness and headaches.  Physical Exam Updated Vital Signs BP (!) 141/87 (BP Location: Right Arm)   Pulse 80   Temp 98.4 F (36.9 C) (Oral)   Resp 16   Ht 5\' 6"  (1.676 m)   Wt 74.8 kg (165 lb)   SpO2 100%   BMI 26.63 kg/m   Physical Exam  Constitutional: He appears well-developed and well-nourished. No distress.  HENT:  Head: Normocephalic and atraumatic.  Eyes: Conjunctivae are normal. Right eye exhibits no discharge. Left eye exhibits no discharge.  Neck: Normal range of motion. Neck supple. No JVD present. No tracheal deviation present.  Cardiovascular: Normal rate and intact distal pulses.   2+ DP/PT pulses bilaterally, no calf tenderness  bilaterally  Pulmonary/Chest: Effort normal.  Abdominal: He exhibits no distension.  Musculoskeletal: He exhibits tenderness. He exhibits no edema.       Right knee: He exhibits normal range of motion, no swelling, no effusion, no ecchymosis, no deformity, no laceration, no erythema, normal alignment, no LCL laxity, normal patellar mobility, normal meniscus and no MCL laxity. No medial joint line, no lateral joint line, no MCL and no LCL tenderness noted.       Left knee: Normal.  Point tender to palpation just inferior to the patella laterally.  No crepitus or deformity noted.  No varus or valgus instability.  Slightly decreased range of motion of the right knee with flexion secondary to pain.  Negative anterior/posterior drawer test.  No tenderness to palpation of the proximal fibula.  5/5 strength of BLE major muscle groups.  Neurological: He is alert. No sensory deficit. He exhibits normal muscle tone.  Fluent speech, no facial droop, sensation intact to soft touch of bilateral lower extremities.  Antalgic gait, but able to Heel Walk and Toe Walk without difficulty.  Skin: Skin is warm and dry. No erythema.  Psychiatric: He has a normal mood and affect. His behavior is normal.  Nursing note and vitals reviewed.    ED Treatments / Results  Labs (all labs ordered are listed, but only abnormal results are displayed) Labs Reviewed - No data to display  EKG  EKG Interpretation None       Radiology Dg Knee Complete 4 Views Right  Result Date: 12/31/2016 CLINICAL DATA:  Fall EXAM: RIGHT KNEE - COMPLETE 4+ VIEW COMPARISON:  None. FINDINGS: No evidence of fracture, dislocation, or joint effusion. No evidence of arthropathy or other focal bone abnormality. Soft tissues are unremarkable. IMPRESSION: Normal right knee. Electronically Signed   By: Deatra RobinsonKevin  Herman M.D.   On: 12/31/2016 00:32    Procedures Procedures (including critical care time)  Medications Ordered in ED Medications - No  data to display   Initial Impression / Assessment and Plan / ED Course  I have reviewed the triage vital signs and the nursing notes.  Pertinent labs & imaging results that were available during my care of the patient were reviewed by me and considered in my medical decision making (see chart for details).     Patient presents with right knee pain secondary to fall earlier today.  Afebrile, vital signs are stable.  No focal neurological deficits on examination.  He is ambulatory and able to bear weight although it is painful.  Radiographs reviewed by me show no fracture or dislocation or soft tissue swelling.  The compartments are soft.  No evidence of quadriceps tendon injury.  RICE therapy indicated and discussed with patient.  He will be given a knee sleeve and crutches for ambulation.  Discussed indications  for return to the ED.  Patient and patient's wife verbalized understanding of and agreement with plan and patient is stable for discharge home at this time.  Final Clinical Impressions(s) / ED Diagnoses   Final diagnoses:  Acute pain of right knee  Fall, initial encounter    New Prescriptions New Prescriptions   No medications on file     Bennye Alm 12/31/16 0053    Devoria Albe, MD 12/31/16 [phone]

## 2016-12-31 NOTE — ED Notes (Signed)
Pt alert & oriented x4, stable gait. Patient given discharge instructions, paperwork & prescription(s). Registration completed in room.  Patient verbalized understanding. Pt left department w/ no further questions. 

## 2017-09-16 ENCOUNTER — Encounter (HOSPITAL_COMMUNITY): Payer: Self-pay

## 2017-09-16 ENCOUNTER — Other Ambulatory Visit: Payer: Self-pay

## 2017-09-16 ENCOUNTER — Emergency Department (HOSPITAL_COMMUNITY)
Admission: EM | Admit: 2017-09-16 | Discharge: 2017-09-16 | Disposition: A | Discharge status: Home / Self Care | Payer: Medicaid Other | Attending: Emergency Medicine | Admitting: Emergency Medicine

## 2017-09-16 DIAGNOSIS — F17210 Nicotine dependence, cigarettes, uncomplicated: Secondary | ICD-10-CM | POA: Diagnosis not present

## 2017-09-16 DIAGNOSIS — K029 Dental caries, unspecified: Secondary | ICD-10-CM | POA: Insufficient documentation

## 2017-09-16 DIAGNOSIS — K0889 Other specified disorders of teeth and supporting structures: Secondary | ICD-10-CM | POA: Diagnosis present

## 2017-09-16 DIAGNOSIS — J45909 Unspecified asthma, uncomplicated: Secondary | ICD-10-CM | POA: Diagnosis not present

## 2017-09-16 MED ORDER — NAPROXEN 250 MG PO TABS: 250.0000 mg | ORAL_TABLET | Freq: Two times a day (BID) | ORAL | 0 refills | Status: AC | PRN

## 2017-09-16 MED ORDER — PENICILLIN V POTASSIUM 250 MG PO TABS: 250.0000 mg | ORAL_TABLET | Freq: Four times a day (QID) | ORAL | 0 refills | Status: AC

## 2017-09-16 NOTE — ED Notes (Signed)
Dental pain on right side x3 days. Ibuprofen and BC taken with no relief.

## 2017-09-16 NOTE — ED Provider Notes (Signed)
[facility] EMERGENCY DEPARTMENT Provider Note   CSN: [id number] Arrival date & time: 09/16/17  [id number]     History   Chief Complaint Chief Complaint  Patient presents with  . Dental Pain    HPI Juan Ward is a 37 y.o. male.  HPI  Pt was seen at 0730. Per pt, c/o gradual onset and persistence of constant right upper tooth "pain" for the past 3 days.  Denies fevers, no intra-oral edema, no rash, no facial swelling, no dysphagia, no neck pain.   The condition is aggravated by nothing. The condition is relieved by nothing. The symptoms have been associated with no other complaints. The patient has no significant history of serious medical conditions.    Past Medical History:  Diagnosis Date  . Asthma   . Seasonal allergies   . Seizures (HCC)     There are no active problems to display for this patient.   Past Surgical History:  Procedure Laterality Date  . HAND SURGERY    . WISDOM TOOTH EXTRACTION          Home Medications    Prior to Admission medications   Medication Sig Start Date End Date Taking? Authorizing Provider  calamine lotion Apply 1 application topically as needed for itching (rash).    [provider]  dexamethasone (DECADRON) 4 MG tablet Take 1 tablet (4 mg total) by mouth 2 (two) times daily with a meal. Patient not taking: Reported on 05/21/2015 04/18/15   Ivery QualeBryant, Hobson, PA-C  hydrOXYzine (VISTARIL) 25 MG capsule Take 1 capsule (25 mg total) by mouth 3 (three) times daily as needed for itching. Patient not taking: Reported on 05/21/2015 04/18/15   Ivery QualeBryant, Hobson, PA-C  lindane lotion (KWELL) 1 % Apply 1 application topically once. Apply to skin from the neck to the toes. Leave on for 6 to 8 hours, then shower off. Patient not taking: Reported on 05/21/2015 04/18/15   Ivery QualeBryant, Hobson, PA-C  ondansetron (ZOFRAN ODT) 8 MG disintegrating tablet Take 1 tablet (8 mg total) by mouth every 8 (eight) hours as needed for nausea or vomiting. Patient not  taking: Reported on 07/07/2015 05/21/15   Margarita Grizzleay, Danielle, MD  permethrin Verner Mould(ELIMITE) 5 % cream Apply to affected area once 07/07/15   Elson AreasSofia, Leslie K, PA-C  predniSONE (DELTASONE) 10 MG tablet 8,6,5,7,8,46,5,4,3,2,1 taper 07/07/15   Elson AreasSofia, Leslie K, PA-C  promethazine (PHENERGAN) 25 MG tablet Take 1 tablet (25 mg total) by mouth every 6 (six) hours as needed for nausea or vomiting. Patient not taking: Reported on 07/07/2015 05/21/15   Margarita Grizzleay, Danielle, MD  triamcinolone cream (KENALOG) 0.1 % Apply 1 application topically 2 (two) times daily. 06/07/15   Devoria AlbeKnapp, Iva, MD    Family History No family history on file.  Social History Social History   Tobacco Use  . Smoking status: Current Every Day Smoker    Packs/day: 1.00    Types: Cigarettes  . Smokeless tobacco: Never Used  Substance Use Topics  . Alcohol use: Yes    Comment: " a little bit"  . Drug use: Yes    Types: Marijuana     Allergies   Patient has no known allergies.   Review of Systems Review of Systems ROS: Statement: All systems negative except as marked or noted in the HPI; Constitutional: Negative for fever and chills. ; ; Eyes: Negative for eye pain and discharge. ; ; ENMT: Positive for dental caries, dental hygiene poor and toothache. Negative for ear pain, bleeding gums, dental  injury, facial deformity, facial swelling, hoarseness, nasal congestion, sinus pressure, sore throat, throat swelling and tongue swollen. ; ; Cardiovascular: Negative for chest pain, palpitations, diaphoresis, dyspnea and peripheral edema. ; ; Respiratory: Negative for cough, wheezing and stridor. ; ; Gastrointestinal: Negative for nausea, vomiting, diarrhea and abdominal pain. ; ; Genitourinary: Negative for dysuria, flank pain and hematuria. ; ; Musculoskeletal: Negative for back pain and neck pain. ; ; Skin: Negative for rash and skin lesion. ; ; Neuro: Negative for headache, lightheadedness and neck stiffness. ;    Physical Exam Updated Vital Signs BP 137/74 (BP  Location: Right Arm)   Pulse 99   Temp 98.1 F (36.7 C) (Oral)   Resp 15   Ht 5\' 6"  (1.676 m)   Wt 63.5 kg (140 lb)   SpO2 99%   BMI 22.60 kg/m   Physical Exam 0735: Physical examination: Vital signs and O2 SAT: Reviewed; Constitutional: Well developed, Well nourished, Well hydrated, In no acute distress; Head and Face: Normocephalic, Atraumatic; Eyes: EOMI, PERRL, No scleral icterus; ENMT: Mouth and pharynx normal, Poor dentition, Widespread dental decay, Left TM normal, Right TM normal, Mucous membranes moist, +upper right 1st molar with dental decay.  No gingival erythema, edema, fluctuance, or drainage.  No intra-oral edema. No submandibular or sublingual edema. No hoarse voice, no drooling, no stridor. No trismus. ; Neck: Supple, Full range of motion, No lymphadenopathy; Cardiovascular: Regular rate and rhythm, No murmur, rub, or gallop; Respiratory: Breath sounds clear & equal bilaterally, No rales, rhonchi, wheezes, Normal respiratory effort/excursion; Chest: Nontender, Movement normal; Extremities: Pulses normal, No tenderness, No edema; Neuro: AA&Ox3, Major CN grossly intact.  No gross focal motor or sensory deficits in extremities.; Skin: Color normal, No rash, No petechiae, Warm, Dry   ED Treatments / Results  Labs (all labs ordered are listed, but only abnormal results are displayed)   EKG None  Radiology   Procedures Procedures (including critical care time)  Medications Ordered in ED Medications - No data to display   Initial Impression / Assessment and Plan / ED Course  I have reviewed the triage vital signs and the nursing notes.  Pertinent labs & imaging results that were available during my care of the patient were reviewed by me and considered in my medical decision making (see chart for details).  MDM Reviewed: previous chart, nursing note and vitals    0740:  Pt encouraged to f/u with dentist or oral surgeon for his dental needs for good continuity of  care and definitive treatment.  Pt verb understanding.    Final Clinical Impressions(s) / ED Diagnoses   Final diagnoses:  None    ED Discharge Orders    None       Conley Jester, DO 09/17/17 1610

## 2017-09-16 NOTE — Discharge Instructions (Signed)
Take the prescriptions as directed.  Call your regular dentist today to schedule a follow up appointment within the week.  Return to the Emergency Department immediately sooner if worsening.

## 2017-09-16 NOTE — ED Triage Notes (Signed)
Pt c/o r sided toothache x 2 days.

## 2019-12-12 ENCOUNTER — Other Ambulatory Visit: Payer: Self-pay

## 2019-12-12 ENCOUNTER — Emergency Department (HOSPITAL_COMMUNITY): Payer: Medicaid Other

## 2019-12-12 ENCOUNTER — Encounter (HOSPITAL_COMMUNITY): Payer: Self-pay | Admitting: *Deleted

## 2019-12-12 ENCOUNTER — Emergency Department (HOSPITAL_COMMUNITY)
Admission: EM | Admit: 2019-12-12 | Discharge: 2019-12-12 | Disposition: A | Discharge status: Home / Self Care | Payer: Medicaid Other | Attending: Emergency Medicine | Admitting: Emergency Medicine

## 2019-12-12 DIAGNOSIS — R102 Pelvic and perineal pain: Secondary | ICD-10-CM | POA: Insufficient documentation

## 2019-12-12 DIAGNOSIS — F17210 Nicotine dependence, cigarettes, uncomplicated: Secondary | ICD-10-CM | POA: Insufficient documentation

## 2019-12-12 DIAGNOSIS — M25572 Pain in left ankle and joints of left foot: Secondary | ICD-10-CM | POA: Insufficient documentation

## 2019-12-12 DIAGNOSIS — R0789 Other chest pain: Secondary | ICD-10-CM | POA: Diagnosis not present

## 2019-12-12 DIAGNOSIS — M25511 Pain in right shoulder: Secondary | ICD-10-CM | POA: Diagnosis present

## 2019-12-12 DIAGNOSIS — J45909 Unspecified asthma, uncomplicated: Secondary | ICD-10-CM | POA: Insufficient documentation

## 2019-12-12 MED ORDER — IBUPROFEN 800 MG PO TABS
800.0000 mg | ORAL_TABLET | Freq: Once | ORAL | Status: AC
  Administered 2019-12-12: 800 mg via ORAL
  Filled 2019-12-12: qty 1
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
## 2019-12-12 NOTE — Discharge Instructions (Addendum)
Your imaging obtained here in the ED today was reassuring.  No fractures or other bony abnormalities.  Please take ibuprofen or Tylenol as needed for pain.  Please follow-up with your primary care provider regarding today's encounter and for ongoing evaluation and management.  I highly encourage you to discontinue smoking as we are already seeing changes on your chest x-ray.  Please consider Nicorette gum or NicoDerm patches, if needed.  Return to the ED or seek immediate medical attention should experience any new or worsening symptoms.
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
## 2021-01-25 ENCOUNTER — Emergency Department (HOSPITAL_COMMUNITY)
Admission: EM | Admit: 2021-01-25 | Discharge: 2021-01-25 | Disposition: A | Discharge status: Home / Self Care | Payer: Medicaid Other | Attending: Emergency Medicine | Admitting: Emergency Medicine

## 2021-01-25 ENCOUNTER — Emergency Department (HOSPITAL_COMMUNITY): Payer: Medicaid Other

## 2021-01-25 ENCOUNTER — Other Ambulatory Visit: Payer: Self-pay

## 2021-01-25 DIAGNOSIS — M545 Low back pain, unspecified: Secondary | ICD-10-CM | POA: Insufficient documentation

## 2021-01-25 DIAGNOSIS — J45909 Unspecified asthma, uncomplicated: Secondary | ICD-10-CM | POA: Insufficient documentation

## 2021-01-25 DIAGNOSIS — F17210 Nicotine dependence, cigarettes, uncomplicated: Secondary | ICD-10-CM | POA: Diagnosis not present

## 2021-01-25 DIAGNOSIS — R109 Unspecified abdominal pain: Secondary | ICD-10-CM | POA: Insufficient documentation

## 2021-01-25 LAB — URINALYSIS, ROUTINE W REFLEX MICROSCOPIC
Bilirubin Urine: NEGATIVE
Glucose, UA: NEGATIVE mg/dL
Ketones, ur: NEGATIVE mg/dL
Leukocytes,Ua: NEGATIVE
Nitrite: NEGATIVE
Protein, ur: 100 mg/dL — AB
Specific Gravity, Urine: >1.03 — ABNORMAL HIGH (ref 1.005–1.030)
pH: 6 (ref 5.0–8.0)

## 2021-01-25 LAB — URINALYSIS, MICROSCOPIC (REFLEX): Bacteria, UA: NONE SEEN

## 2021-01-25 MED ORDER — HYDROCODONE-ACETAMINOPHEN 5-325 MG PO TABS: 2.0000 | ORAL_TABLET | ORAL | 0 refills | Status: AC | PRN

## 2021-01-25 MED ORDER — KETOROLAC TROMETHAMINE 30 MG/ML IJ SOLN
30.0000 mg | Freq: Once | INTRAMUSCULAR | Status: AC
  Administered 2021-01-25: 30 mg via INTRAMUSCULAR
  Filled 2021-01-25: qty 1

## 2021-01-25 MED ORDER — NAPROXEN 500 MG PO TABS: 500.0000 mg | ORAL_TABLET | Freq: Two times a day (BID) | ORAL | 0 refills | Status: AC

## 2021-01-25 NOTE — ED Provider Notes (Signed)
[facility] EMERGENCY DEPARTMENT Provider Note   CSN: [id number] Arrival date & time: 01/25/21  1332     History Chief Complaint  Patient presents with   Back Pain    Juan Ward is a 40 y.o. male.  Patient with a complaint of lower back pain since Wednesday.  Its bilateral, radiates around to the front.  He said it came on after washing a lot of dishes.  No radiation of pain into his legs.  No numbness or weakness to his lower extremities or feet.  No prior history of anything similar.  No history of kidney stones.  Pain is worse when he tries to lay down.  Is more comfortable sitting.  Patient denies any fall or injury.      Past Medical History:  Diagnosis Date   Asthma    Seasonal allergies    Seizures (HCC)     There are no problems to display for this patient.   Past Surgical History:  Procedure Laterality Date   HAND SURGERY     WISDOM TOOTH EXTRACTION         No family history on file.  Social History   Tobacco Use   Smoking status: Every Day    Packs/day: 1.00    Types: Cigarettes   Smokeless tobacco: Never  Vaping Use   Vaping Use: Never used  Substance Use Topics   Alcohol use: Yes    Comment: " a little bit"   Drug use: Yes    Types: Marijuana    Home Medications Prior to Admission medications   Medication Sig Start Date End Date Taking? Authorizing Provider  calamine lotion Apply 1 application topically as needed for itching (rash).   Yes [provider]  HYDROcodone-acetaminophen (NORCO/VICODIN) 5-325 MG tablet Take 2 tablets by mouth every 4 (four) hours as needed. 01/25/21  Yes Vanetta Mulders, MD  naproxen (NAPROSYN) 500 MG tablet Take 1 tablet (500 mg total) by mouth 2 (two) times daily. 01/25/21  Yes Vanetta Mulders, MD  dexamethasone (DECADRON) 4 MG tablet Take 1 tablet (4 mg total) by mouth 2 (two) times daily with a meal. Patient not taking: Reported on 05/21/2015 04/18/15   Ivery Quale, PA-C  hydrOXYzine  (VISTARIL) 25 MG capsule Take 1 capsule (25 mg total) by mouth 3 (three) times daily as needed for itching. Patient not taking: Reported on 05/21/2015 04/18/15   Ivery Quale, PA-C  lindane lotion (KWELL) 1 % Apply 1 application topically once. Apply to skin from the neck to the toes. Leave on for 6 to 8 hours, then shower off. Patient not taking: Reported on 05/21/2015 04/18/15   Ivery Quale, PA-C  naproxen (NAPROSYN) 250 MG tablet Take 1 tablet (250 mg total) by mouth 2 (two) times daily as needed for mild pain or moderate pain (take with food). Patient not taking: Reported on 01/25/2021 09/16/17   Mirl Jester, DO  ondansetron (ZOFRAN ODT) 8 MG disintegrating tablet Take 1 tablet (8 mg total) by mouth every 8 (eight) hours as needed for nausea or vomiting. Patient not taking: Reported on 07/07/2015 05/21/15   Margarita Grizzle, MD  penicillin v potassium (VEETID) 250 MG tablet Take 1 tablet (250 mg total) by mouth 4 (four) times daily. Patient not taking: Reported on 01/25/2021 09/16/17   Zamarian Jester, DO  permethrin (ELIMITE) 5 % cream Apply to affected area once Patient not taking: Reported on 01/25/2021 07/07/15   Elson Areas, PA-C  predniSONE (DELTASONE) 10 MG tablet 6,5,4,3,2,1 taper  Patient not taking: Reported on 01/25/2021 07/07/15   Fransico Meadow, PA-C  promethazine (PHENERGAN) 25 MG tablet Take 1 tablet (25 mg total) by mouth every 6 (six) hours as needed for nausea or vomiting. Patient not taking: Reported on 07/07/2015 05/21/15   Pattricia Boss, MD  triamcinolone cream (KENALOG) 0.1 % Apply 1 application topically 2 (two) times daily. Patient not taking: Reported on 01/25/2021 06/07/15   Rolland Porter, MD    Allergies    Patient has no known allergies.  Review of Systems   Review of Systems  Constitutional:  Negative for chills and fever.  HENT:  Negative for ear pain and sore throat.   Eyes:  Negative for pain and visual disturbance.  Respiratory:  Negative for cough and  shortness of breath.   Cardiovascular:  Negative for chest pain and palpitations.  Gastrointestinal:  Negative for abdominal pain and vomiting.  Genitourinary:  Negative for dysuria and hematuria.  Musculoskeletal:  Positive for back pain. Negative for arthralgias.  Skin:  Negative for color change and rash.  Neurological:  Negative for seizures, syncope, weakness and numbness.  All other systems reviewed and are negative.  Physical Exam Updated Vital Signs BP 121/72 (BP Location: Right Arm)   Pulse 88   Temp 99.7 F (37.6 C) (Oral)   Resp 14   Ht 1.676 m (5\' 6" )   Wt 72.6 kg   SpO2 95%   BMI 25.82 kg/m   Physical Exam Vitals and nursing note reviewed.  Constitutional:      General: He is not in acute distress.    Appearance: Normal appearance. He is well-developed.  HENT:     Head: Normocephalic and atraumatic.  Eyes:     Extraocular Movements: Extraocular movements intact.     Conjunctiva/sclera: Conjunctivae normal.     Pupils: Pupils are equal, round, and reactive to light.  Cardiovascular:     Rate and Rhythm: Normal rate and regular rhythm.     Heart sounds: No murmur heard. Pulmonary:     Effort: Pulmonary effort is normal. No respiratory distress.     Breath sounds: Normal breath sounds.  Abdominal:     Palpations: Abdomen is soft.     Tenderness: There is no abdominal tenderness.  Musculoskeletal:        General: No swelling.     Cervical back: Normal range of motion and neck supple.     Comments: Patient comfortable sitting.  Some tenderness to palpation to bilateral paraspinous lumbar area.  Skin:    General: Skin is warm and dry.     Capillary Refill: Capillary refill takes less than 2 seconds.  Neurological:     General: No focal deficit present.     Mental Status: He is alert.  Psychiatric:        Mood and Affect: Mood normal.    ED Results / Procedures / Treatments   Labs (all labs ordered are listed, but only abnormal results are  displayed) Labs Reviewed  URINALYSIS, ROUTINE W REFLEX MICROSCOPIC - Abnormal; Notable for the following components:      Result Value   APPearance HAZY (*)    Specific Gravity, Urine >1.030 (*)    Hgb urine dipstick MODERATE (*)    Protein, ur 100 (*)    All other components within normal limits  URINALYSIS, MICROSCOPIC (REFLEX)    EKG None  Radiology CT Renal Stone Study  Result Date: 01/25/2021 CLINICAL DATA:  Flank pain. EXAM: CT ABDOMEN AND PELVIS WITHOUT  CONTRAST TECHNIQUE: Multidetector CT imaging of the abdomen and pelvis was performed following the standard protocol without IV contrast. COMPARISON:  None. FINDINGS: Lower chest: There is a 2 mm nodular density in left lower lobe image 4/23. Hepatobiliary: No focal liver abnormality is seen. No gallstones, gallbladder wall thickening, or biliary dilatation. Pancreas: Unremarkable. No pancreatic ductal dilatation or surrounding inflammatory changes. Spleen: Normal in size without focal abnormality. Adrenals/Urinary Tract: Adrenal glands are unremarkable. Kidneys are normal, without renal calculi, focal lesion, or hydronephrosis. Bladder is unremarkable. The bladder is completely decompressed. Stomach/Bowel: Stomach is within normal limits. Appendix appears normal. No evidence of bowel wall thickening, distention, or inflammatory changes. There is sigmoid colon diverticulosis without evidence for acute diverticulitis. Vascular/Lymphatic: No significant vascular findings are present. No enlarged abdominal or pelvic lymph nodes. Reproductive: Prostate is unremarkable. Other: No abdominal wall hernia or abnormality. There is a small fat containing umbilical hernia. There is no ascites. Musculoskeletal: No acute or significant osseous findings. IMPRESSION: 1. No acute localizing process in the abdomen or pelvis. 2. Sigmoid colon diverticulosis without evidence for acute diverticulitis. Electronically Signed   By: Ronney Asters M.D.   On:  01/25/2021 15:16    Procedures Procedures   Medications Ordered in ED Medications  ketorolac (TORADOL) 30 MG/ML injection 30 mg (30 mg Intramuscular Given 01/25/21 1501)    ED Course  I have reviewed the triage vital signs and the nursing notes.  Pertinent labs & imaging results that were available during my care of the patient were reviewed by me and considered in my medical decision making (see chart for details).    MDM Rules/Calculators/A&P                           Symptoms seem to be musculoskeletal in nature.  Urinalysis was negative and CT scan renal study negative.  We will treat with Naprosyn and prepack of hydrocodone.  Patient without any sciatic symptoms.  Patient comfortable sitting. Final Clinical Impression(s) / ED Diagnoses Final diagnoses:  Acute bilateral low back pain without sciatica    Rx / DC Orders ED Discharge Orders          Ordered    naproxen (NAPROSYN) 500 MG tablet  2 times daily        01/25/21 1627    HYDROcodone-acetaminophen (NORCO/VICODIN) 5-325 MG tablet  Every 4 hours PRN        01/25/21 1630             Fredia Sorrow, MD 01/25/21 1640

## 2021-01-25 NOTE — ED Notes (Signed)
Pt refused IV insertion and blood draw. States he just wanted to find out what is going on. This nurse explained that diagnosis may require blood work or scans that may need IV access. Pt still refused.

## 2021-01-25 NOTE — ED Provider Notes (Signed)
Emergency Medicine Provider Triage Evaluation Note  Juan Ward , a 40 y.o. male  was evaluated in triage.  Pt complains of low back pain radiating to the bilateral abdomen.  Review of Systems  Positive: Back pain Negative: No trauma, no dysuria, no hematuria  Physical Exam  BP 136/82 (BP Location: Right Arm)   Pulse (!) 104   Temp 99.7 F (37.6 C) (Oral)   Resp (!) 22   Ht 5\' 6"  (1.676 m)   Wt 72.6 kg   SpO2 94%   BMI 25.82 kg/m  Gen:   Awake, uncomfortable, walks bent forward Resp:  Normal effort  MSK:   Moves extremities without difficulty  Other:  Back nontender to palpation  Medical Decision Making  Medically screening exam initiated at 2:53 PM.  Appropriate orders placed.  Juan Ward was informed that the remainder of the evaluation will be completed by another provider, this initial triage assessment does not replace that evaluation, and the importance of remaining in the ED until their evaluation is complete.  Rule out urolithiasis, UTI, musculoskeletal injury or cause of pain.   Stevphen Rochester, MD 01/25/21 [phone]

## 2021-01-25 NOTE — ED Triage Notes (Signed)
Pt co bilateral flank pain that radiates around to bilateral groins. Denies hx of kidney stones, denies blood in urine. Denies fever. Pt is obviously in discomfort in triage. Diaphoretic.

## 2021-01-25 NOTE — Discharge Instructions (Addendum)
Take the Naprosyn or over-the-counter Aleve for the back pain.  If you use over-the-counter Aleve take 2 tablets every 12 hours.  Also take the hydrocodone as needed for additional pain relief.  Return if not improving over the next several days.

## 2021-01-28 MED FILL — Hydrocodone-Acetaminophen Tab 5-325 MG: ORAL | Qty: 6 | Status: AC

## 2022-02-25 IMAGING — CT CT RENAL STONE PROTOCOL
2 of 4 series · 17 of 46 positions shown, 19 images · non-contrast
Comparison: None.

CLINICAL DATA: Flank pain.

EXAM:
CT ABDOMEN AND PELVIS WITHOUT CONTRAST
TECHNIQUE: Multidetector CT imaging of the abdomen and pelvis was performed
following the standard protocol without IV contrast.

[Series 2: axial st · axial · 0.81mm/px · z∈[+953,+1368]mm · 14 of 95 slices shown, 16 images]
[im 6/95  soft-tissue]
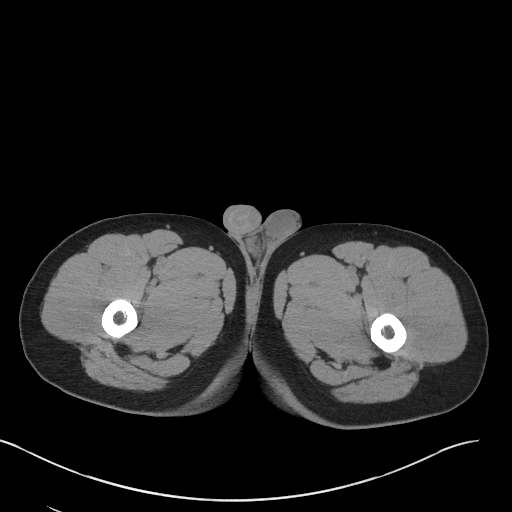
[im 6/95  bone]
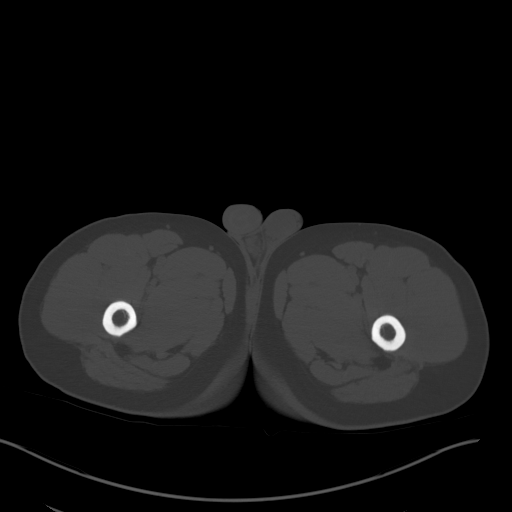
[im 12/95  soft-tissue]
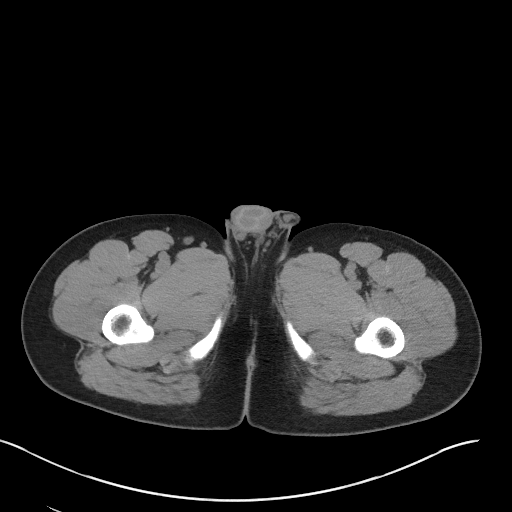
[im 17/95  soft-tissue]
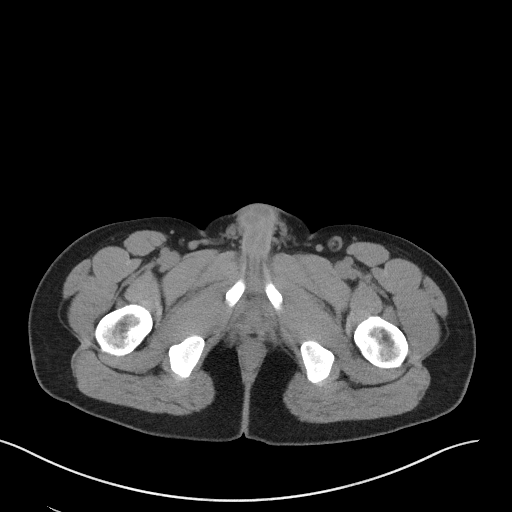
[im 28/95  soft-tissue]
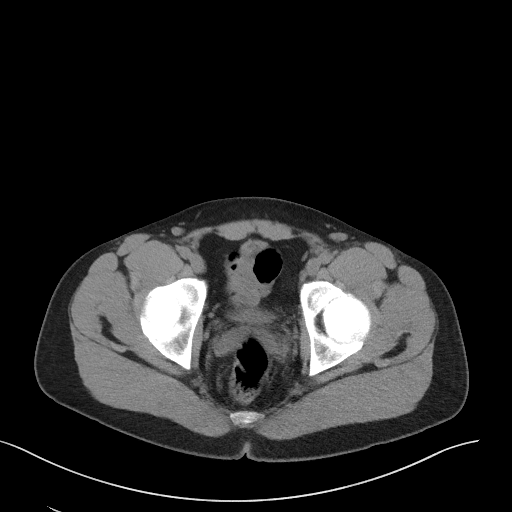
[im 34/95  soft-tissue]
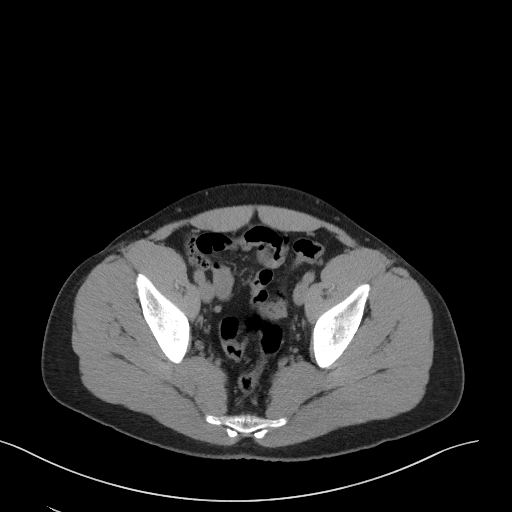
[im 39/95  soft-tissue]
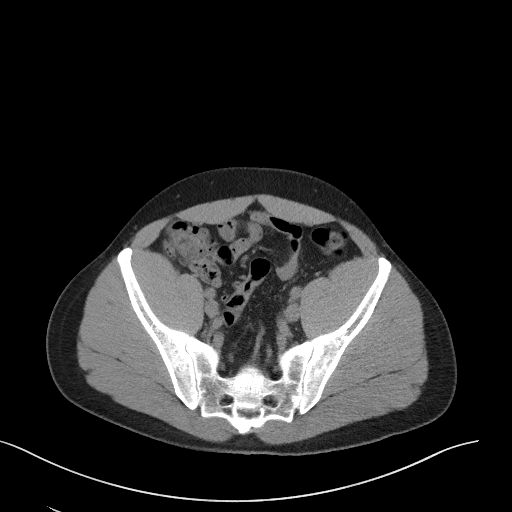
[im 45/95  soft-tissue]
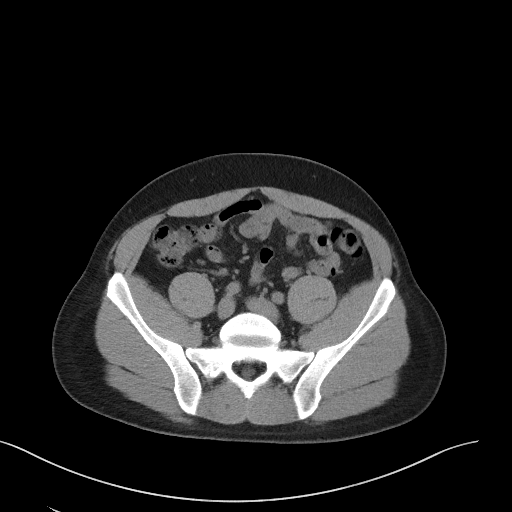
[im 50/95  soft-tissue]
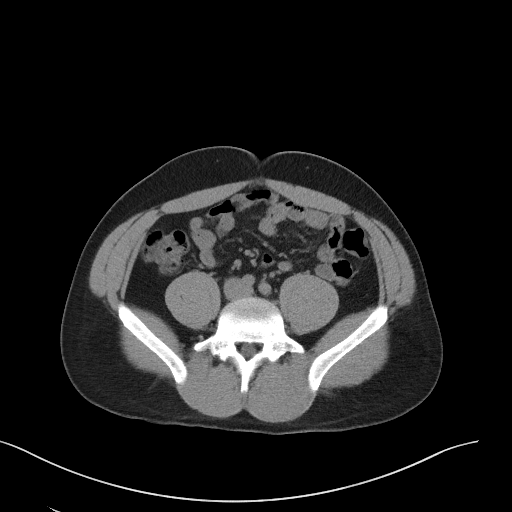
[im 56/95  soft-tissue]
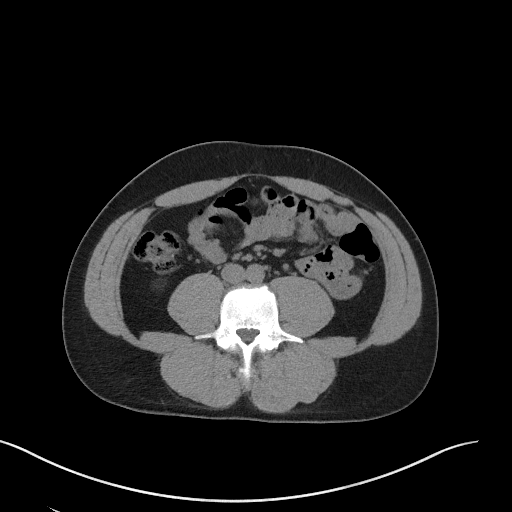
[im 56/95  bone]
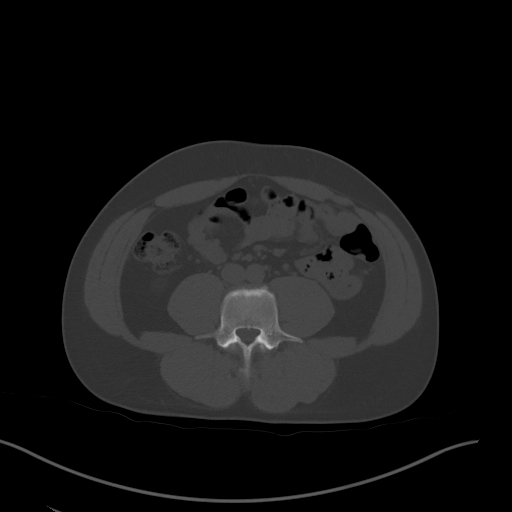
[im 61/95  soft-tissue]
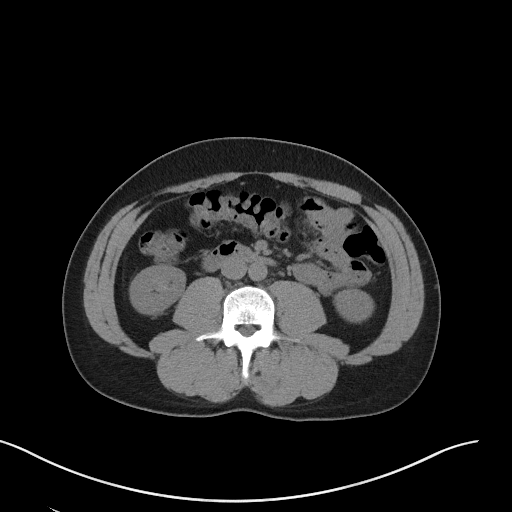
[im 72/95  soft-tissue]
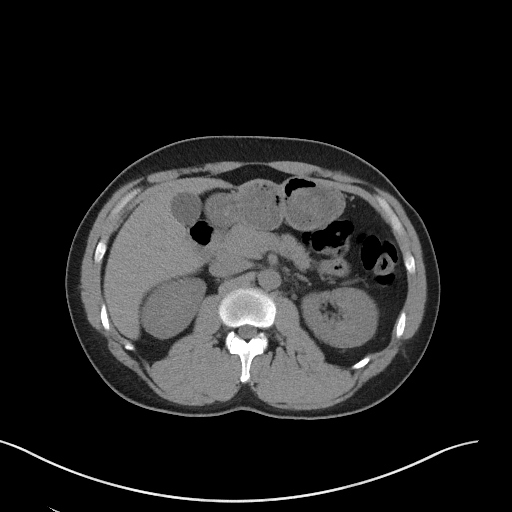
[im 78/95  soft-tissue]
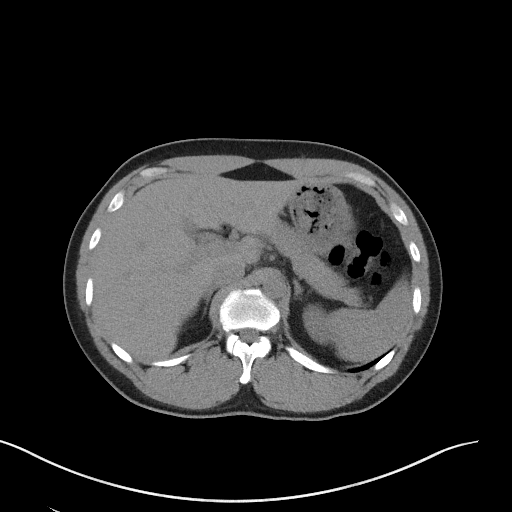
[im 83/95  soft-tissue]
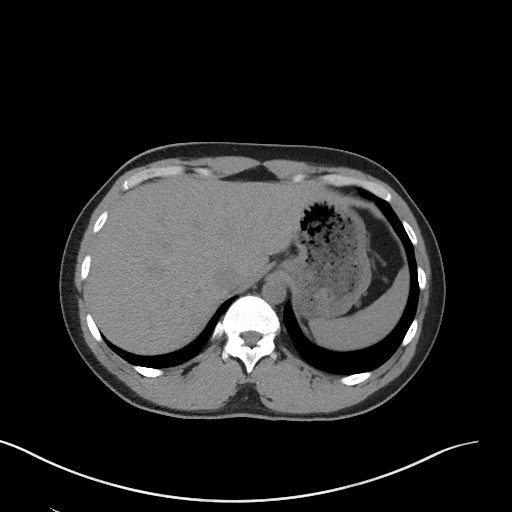
[im 89/95  soft-tissue]
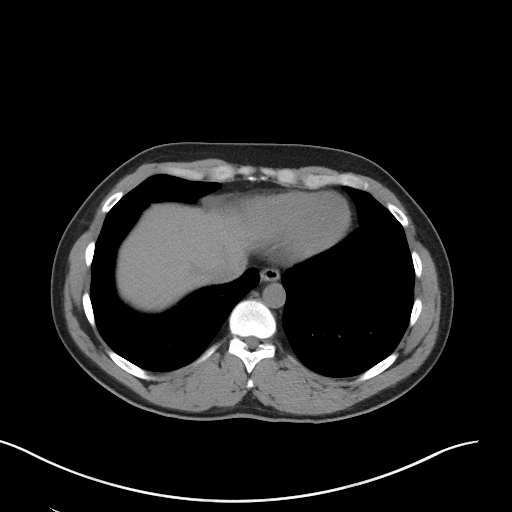

[Series 5: coronal st · coronal · 0.87mm/px · 3 of 91 slices shown]
[im 31/91  soft-tissue]
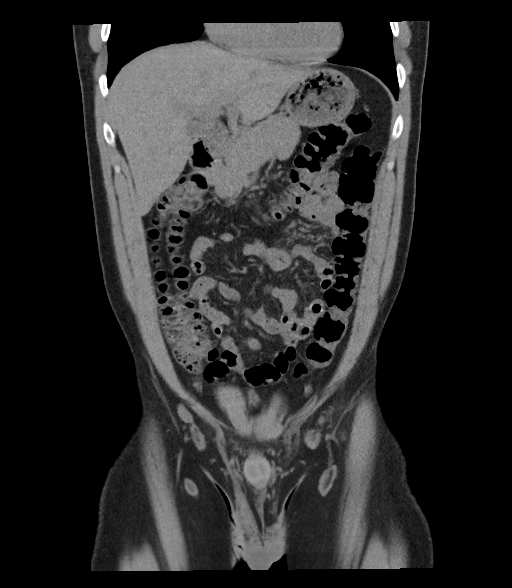
[im 41/91  soft-tissue]
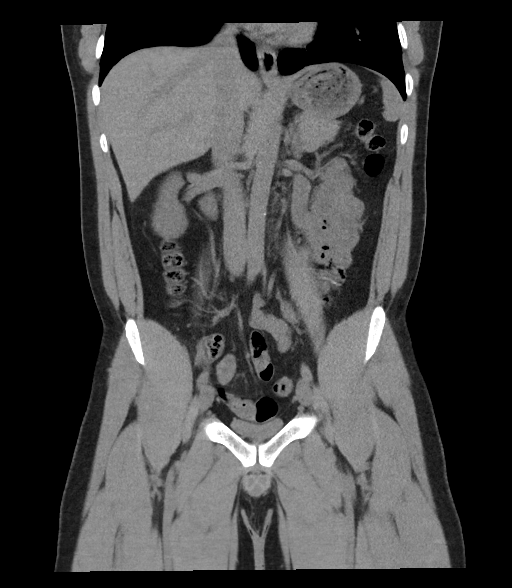
[im 51/91  soft-tissue]
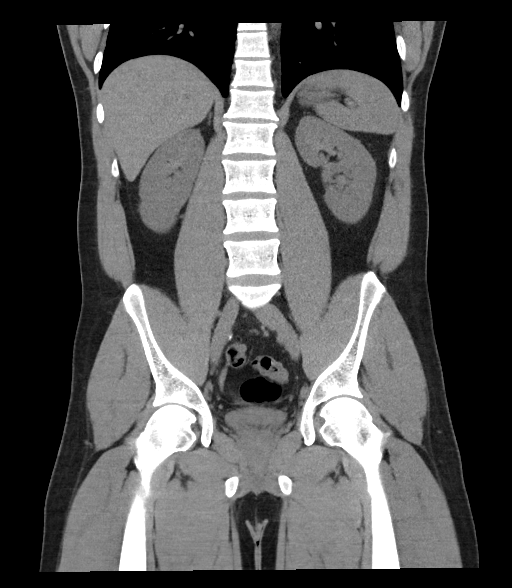

[17 of 46 positions shown; findings below may reference images not displayed]

FINDINGS: Lower chest: There is a 2 mm nodular density in left lower lobe
image [DATE].

Hepatobiliary: No focal liver abnormality is seen. No gallstones,
gallbladder wall thickening, or biliary dilatation.

Pancreas: Unremarkable. No pancreatic ductal dilatation or
surrounding inflammatory changes.

Spleen: Normal in size without focal abnormality.

Adrenals/Urinary Tract: Adrenal glands are unremarkable. Kidneys are
normal, without renal calculi, focal lesion, or hydronephrosis.
Bladder is unremarkable. The bladder is completely decompressed.

Stomach/Bowel: Stomach is within normal limits. Appendix appears
normal. No evidence of bowel wall thickening, distention, or
inflammatory changes. There is sigmoid colon diverticulosis without
evidence for acute diverticulitis.

Vascular/Lymphatic: No significant vascular findings are present. No
enlarged abdominal or pelvic lymph nodes.

Reproductive: Prostate is unremarkable.

Other: No abdominal wall hernia or abnormality. There is a small fat
containing umbilical hernia. There is no ascites.

Musculoskeletal: No acute or significant osseous findings.
IMPRESSION: 1. No acute localizing process in the abdomen or pelvis.
2. Sigmoid colon diverticulosis without evidence for acute
diverticulitis.
